# Patient Record
Sex: Female | Born: 1990 | Race: White | Hispanic: Yes | Marital: Single | Smoking: Never smoker
Health system: Southern US, Community
[De-identification: ages and names within clinical notes are randomized; demographics above are authoritative.]

## PROBLEM LIST (undated history)

## (undated) DIAGNOSIS — K831 Obstruction of bile duct: Secondary | ICD-10-CM

## (undated) DIAGNOSIS — N921 Excessive and frequent menstruation with irregular cycle: Secondary | ICD-10-CM

## (undated) HISTORY — PX: NO PAST SURGERIES: SHX2092

## (undated) HISTORY — DX: Excessive and frequent menstruation with irregular cycle: N92.1

## (undated) HISTORY — DX: Obstruction of bile duct: K83.1

---

## 2018-03-28 ENCOUNTER — Encounter (INDEPENDENT_AMBULATORY_CARE_PROVIDER_SITE_OTHER): Payer: Self-pay | Admitting: Nurse Practitioner

## 2018-03-28 ENCOUNTER — Encounter (INDEPENDENT_AMBULATORY_CARE_PROVIDER_SITE_OTHER): Payer: Self-pay | Admitting: Physician Assistant

## 2018-03-28 ENCOUNTER — Ambulatory Visit (INDEPENDENT_AMBULATORY_CARE_PROVIDER_SITE_OTHER): Payer: Self-pay | Admitting: Nurse Practitioner

## 2018-03-28 VITALS — BP 103/69 | HR 65 | Temp 98.5°F | Ht 58.27 in | Wt 114.8 lb

## 2018-03-28 DIAGNOSIS — Z833 Family history of diabetes mellitus: Secondary | ICD-10-CM

## 2018-03-28 DIAGNOSIS — N921 Excessive and frequent menstruation with irregular cycle: Secondary | ICD-10-CM

## 2018-03-28 DIAGNOSIS — Z131 Encounter for screening for diabetes mellitus: Secondary | ICD-10-CM

## 2018-03-28 DIAGNOSIS — R0789 Other chest pain: Secondary | ICD-10-CM

## 2018-03-28 LAB — POCT GLYCOSYLATED HEMOGLOBIN (HGB A1C): HEMOGLOBIN A1C: 5.4

## 2018-03-28 NOTE — Patient Instructions (Addendum)
Metrorragia (Metrorrhagia) La metrorragia es una hemorragia procedente del tero que ocurre de manera irregular pero frecuente. La hemorragia suele presentarse entre una menstruacin y la siguiente. INSTRUCCIONES PARA EL CUIDADO EN EL HOGAR Est atenta a cualquier cambio en los sntomas. Estas indicaciones pueden ayudarla con el trastorno: Comidas  Siga una dieta equilibrada. Incluya alimentos con [employer]Ex de hierro, como hgado, carne, Oceanographer, verduras de hoja verde y [place].  Si tiene estreimiento: ? [employer]. ? Consuma frutas y verduras con alto contenido de agua y [place], Schwana espinaca, zanahorias, frambuesas, manzanas y mango. Medicamentos  [employer] de venta libre y los recetados solamente como se lo haya indicado el mdico.  No haga cambios en los medicamentos sin hablar con el mdico.  La aspirina o los medicamentos que la contienen pueden aumentar la hemorragia. No tome esos medicamentos: ? Durante la semana previa a Tax adviser. ? Durante la [employer].  Si le recetaron comprimidos de hierro, Scientist, forensic se lo haya indicado el mdico. Estos ayudan a Restaurant manager, fast food hierro que el organismo pierde debido a este trastorno. Actividad  Si debe cambiarse el apsito o el tampn ms de una vez cada 2horas: ? Acustese con los pies elevados. ? Colquese una compresa fra en la parte baja del abdomen. ? Haga todo el reposo que pueda hasta que la hemorragia se detenga o disminuya.  No trate de Management consultant que la hemorragia se detenga y los niveles de hierro en la sangre se normalicen. Otras indicaciones  [employer], anote lo siguiente: ? La fecha de comienzo de Tax adviser. ? La fecha de su finalizacin. ? Los [employer] que tiene una hemorragia anormal. ? Los problemas que advierte.  Concurra a todas las visitas de control como se lo haya indicado el mdico. Esto es importante. SOLICITE ATENCIN MDICA SI:  Se siente  dbil o que va a desvanecerse.  Tiene nuseas y vmitos.  No puede comer ni beber sin vomitar.  Tiene mareos o diarrea mientras est [employer].  Est tomando anticonceptivos u hormonas, y desea cambiar o suspender estos medicamentos. SOLICITE ATENCIN MDICA DE INMEDIATO SI:  Tiene escalofros o fiebre.  Debe cambiarse el apsito o el tampn ms de una vez por hora.  La hemorragia se vuelve abundante.  El flujo menstrual contiene cogulos.  Siente dolor en el abdomen.  Pierde la conciencia.  Le aparece una erupcin cutnea. Esta informacin no tiene Theme park manager el consejo del mdico. Asegrese de hacerle al mdico cualquier pregunta que tenga. Document Released: 08/11/2005 Document Revised: 07/23/2015 Document Reviewed: 01/27/2015 Elsevier Interactive Patient Education  2018 [employer].  [employer] del mtodo anticonceptivo (Contraception Choices) La anticoncepcin (control de la natalidad) es el uso de cualquier mtodo o dispositivo para Location manager. A continuacin se indican algunos de esos mtodos. ANTICONCEPTIVOS HORMONALES  Un pequeo tubo colocado bajo la piel de la parte superior del brazo (implante). El tubo puede Geneticist, molecular durante 3 aos. El implante debe quitarse despus de 3 aos.  Inyecciones que se aplican cada 3 meses.  Pldoras que deben [employer]Life.  Parches que se cambian una vez por semana.  Un anillo que se coloca en la vagina (anillo vaginal). El anillo se deja en su lugar durante 3 semanas y se retira durante 1 semana. Luego se coloca un nuevo anillo en la vagina.  Pldoras para el control de la natalidad despus de Warehouse manager sexo (relaciones sexuales) sin proteccin.  ANTICONCEPTIVOS  DE Emeline Darling cubierta delgada que se [place] sobe el pene (condn masculino) que se Diplomatic Services operational officer las relaciones sexuales.  Una cubierta blanda y suelta que se coloca en la vagina (condn femenino) antes de las  relaciones sexuales.  Un dispositivo de goma que se aplica sobre el cuello del tero (diafragma). Este dispositivo debe ser hecho para usted. Se coloca en la vagina antes de Management consultant. Debe dejarlo colocado en la vagina durante 6 a 8 horas despus de las [employer].  Un capuchn pequeo y [employer] Pont se fija sobre el cuello del tero (capuchn cervical). Este capuchn debe ser hecho para usted. Debe dejarlo colocado en la vagina durante 48 horas despus de las [employer].  Una esponja que se coloca en la vagina antes de Management consultant.  Una sustancia qumica que destruye o impide que los espermatozoides ingresen al cuello y al tero (espermicida). La sustancia qumica puede ser en crema, gel, espuma o pldoras.  DISPOSITIVO DE CONTROL INTRAUTERINO (DIU)  El DIU es un pequeo dispositivo plstico en forma de T. Se coloca dentro del tero. Hay dos tipos de DIU: ? DIU de cobre. El dispositivo est recubierto en alambre de cobre. El cobre produce un lquido que [employer] Stores espermatozoides. Puede permanecer colocado durante 10 aos. ? DIU hormonal. La hormona impide que ocurra el embarazo. Puede permanecer colocado durante 5 aos.  MTODOS PERMANENTES  La mujer puede hacerse sellar, ligar u obstruir las trompas de [place] durante [place]. Esto impide que el vulo llegue hasta el tero.  El mdico coloca un alambre diminuto o lo inserta en cada una de las trompas de [place]. Esto produce un tejido cicatrizal que obstruye las trompas de [place].  En el hombre pueden ligarse los conductos por los que pasan los espermatozoides (vasectoma).  CONTROL DE LA NATALIDAD POR PLANIFICACIN FAMILIAR NATURAL  La planificacin familiar natural significa no tener Clinical research associate o usar un mtodo anticonceptivo de Warehouse manager en los perodos frtiles de la [place].  Use a un almanaque para llevar un registro de la extensin de cada perodo y para [employer] Dynamics que puede quedar [place].  Evite tener relaciones sexuales durante la ovulacin.  Use un termmetro para medir la Arts development officer. Tambin reconozca los sntomas de la ovulacin.  El momento de EchoStar relaciones sexuales debe ser despus de que la mujer [place]. Use condones para protegerse de las enfermedades de transmisin sexual (ETS). Hgalo independientemente del tipo de [employer] use. Hable con su mdico acerca de cul es el mejor mtodo anticonceptivo para usted. Esta informacin no tiene Theme park manager el consejo del mdico. Asegrese de hacerle al mdico cualquier pregunta que tenga. Document Released: 02/16/2011 Document Revised: 11/06/2013 Document Reviewed: 05/23/2013 Elsevier Interactive Patient Education  2017 Elsevier Inc.  Informacin sobre el dispositivo intrauterino (Intrauterine Device Information) Un dispositivo intrauterino (DIU) se inserta en el tero e impide el embarazo. Hay dos tipos de DIU:  DIU de cobre: este tipo de DIU est recubierto con un alambre de cobre y se inserta dentro del tero. El cobre hace que el tero y las trompas de [place] produzcan un liquido que [employer] espermatozoides. El DIU de cobre puede Geneticist, molecular durante 10 aos.  DIU con hormona: este tipo de DIU contiene la hormona progestina (progesterona sinttica). Las hormonas hacen que el moco cervical se haga ms espeso, lo que evita que el esperma ingrese al tero. Tambin hace que la membrana que recubre  internamente al tero sea ms delgada lo que impide el implante del vulo fertilizado. La hormona debilita o destruye los espermatozoides que ingresan al tero. Alguno de los tipos de DIU hormonal pueden Geneticist, molecular durante 5 aos y otros tipos pueden dejarse en el lugar por 3 aos. El mdico se asegurar de que usted sea una buena candidata para usar el DIU. Converse con su mdico acerca de los posibles efectos secundarios. VENTAJASDEL  DISPOSITIVO INTRAUTERINO  El DIU es muy eficaz, reversible, de accin prolongada y de bajo mantenimiento.  No hay efectos secundarios relacionados con el estrgeno.  El DIU puede ser utilizado durante la Market researcher.  No est asociado con el aumento de [place].  Funciona inmediatamente despus de la insercin.  El DIU hormonal funciona inmediatamente si se inserta dentro de los [address] del inicio del perodo. Ser necesario que utilice un mtodo anticonceptivo adicional durante 7 das si el DIU hormonal se inserta en algn otro momento del ciclo.  El DIU de cobre no interfiere con las hormonas femeninas.  El DIU hormonal puede hacer que los perodos menstruales abundantes se hagan ms ligeros y que haya menos clicos.  El DIU hormonal puede usarse durante 3 a 5 aos.  El DIU de cobre puede usarse durante 10 aos.  DESVENTAJASDEL DISPOSITIVO [employer] DIU hormonal puede estar asociado con patrones de sangrado irregular.  El DIU de cobre puede hacer que el flujo menstrual ms abundante y doloroso.  Puede experimentar clicos y sangrado vaginal despus de la insercin.  Esta informacin no tiene Theme park manager el consejo del mdico. Asegrese de hacerle al mdico cualquier pregunta que tenga. Document Released: 04/21/2010 Document Revised: 02/23/2016 Document Reviewed: 04/22/2013 Elsevier Interactive Patient Education  2017 [employer].  Informacin sobre los anticonceptivos orales (Oral Contraception Information) Los anticonceptivos orales (ACO) son medicamentos que se utilizan para Location manager. Su funcin es [employer] Corporation ovarios liberen vulos. Las hormonas de los ACO tambin hacen que el moco cervical se haga ms espeso, lo que evita que el esperma ingrese al tero. Tambin hacen que la membrana que recubre internamente al tero se vuelva ms fina, lo que no permite que el huevo fertilizado se adhiera a la pared del tero. Los ACO son muy efectivos cuando se toman  exactamente como se prescriben. Sin embargo, no previenen contra las enfermedades de transmisin sexual (ETS). La prctica del sexo seguro, como el uso de preservativos, junto con la [place], [place] a prevenir ese tipo de enfermedades. Antes de tomar la pldora, usted debe hacerse un examen fsico y un test de Pap. El mdico podr indicarle anlisis de [place], si es necesario. El mdico se asegurar de que usted sea [place] buena candidata para usar anticonceptivos orales. Converse con su mdico acerca de los posibles efectos secundarios de los ACO que podran recetarle. Cuando se inicia el uso de ACO, puede llevar 2 a 3 meses para que su organismo se adapte a los cambios en los niveles hormonales. TIPOS DE ANTICONCEPTIVOS ORALES  Pldora combinada: esta pldora contiene las hormonas estrgeno y progestina (progesterona sinttica). La pldora combinada viene en envases para [address], [address] o [address] Algunos tipos de pldoras combinadas deben tomarse de manera continua (pldoras para 365 das). En los envases para [address] usted no tomar las pldoras durante 7 [address] despus de la ltima pldora. En los envases para [address], la pldora se toma [employer]. Las ltimas 7 no contienen hormonas. Ciertos tipos de pldoras  tienen ms de 21 pldoras que contienen hormonas. En los envases para [address] las primeras 84 pldoras contienen ambas hormonas y las ltimas 7 pldoras no contienen hormonas o contienen slo Cabin crew.  La minipldora: esta pldora contiene la hormona progesterona solamente. Es necesario tomarla todos los das de [place] continua. Es importante que las tome a la misma hora todos [place]. Viene en envases de 28 pldoras. Las 28 pldoras contienen la hormona. VENTAJAS DE LOS ANTICONCEPTIVOS ORALES  Disminuye los sntomas premenstruales.  Se [place] para tratar los [employer].  Regula el ciclo menstrual.  Disminuye el ciclo menstrual abundante.  Puede mejorar el acn, segn el  tipo de pldora.  Trata hemorragias uterinas anormales.  Trata el sndrome ovrico poliqustico.  Trata la endometriosis.  Pueden usarse como anticonceptivo de Associate Professor. FACTORES QUE PUEDEN HACER QUE LOS ANTICONCEPTIVOS ORALES SEAN MENOS EFECTIVOS Pueden ser menos efectivos si:  Olvid tomar la [employer] a la misma hora.  Tiene una enfermedad estomacal o intestinal que disminuye la absorcin de la pldora.  Ingiere simultneamente los anticonceptivos orales junto con otros medicamentos que los hacen menos efectivos, como antibiticos, ciertos medicamentos para el VIH y algunos medicamentos para las convulsiones.  Usted toma anticonceptivos orales que han vencido.  Cuando se [place] el envase de [place], se olvida de recomenzar el uso [employer] 7. RIESGOS ASOCIADOS AL USO DE ANTICONCEPTIVOS ORALES Los anticonceptivos orales pueden en algunos casos causar efectos secundarios como:  Dolor de [place].  Nuseas.  Inflamacin mamaria.  Hemorragia vaginal o manchado irregular. Las pldoras combinadas tambin se asocian a un pequeo aumento en el riesgo de:  Cogulos sanguneos.  Ataque cardaco.  Ictus. Esta informacin no tiene Theme park manager el consejo del mdico. Asegrese de hacerle al mdico cualquier pregunta que tenga. Document Released: 08/11/2005 Document Revised: 02/23/2016 Document Reviewed: 04/22/2013 Elsevier Interactive Patient Education  [employer].

## 2018-03-28 NOTE — Progress Notes (Signed)
3  Assessment & Plan:  Carrie Delacruz was seen today for new patient (initial visit).  Diagnoses and all orders for this visit:  Other chest pain -     CBC -     CMP14+EGFR -     Lipid panel -     TSH  Family history of diabetes mellitus in mother -     Hemoglobin A1c  Metrorrhagia Handout given on various types of birth control agents. Patient is not ready to make a decision today.    Patient has been counseled on age-appropriate routine health concerns for screening and prevention. These are reviewed and up-to-date. Referrals have been placed accordingly. Immunizations are up-to-date or declined.    Subjective:   Chief Complaint  Patient presents with  . New Patient (Initial Visit)    chest pain   HPI Carrie Delacruz 27 y.o. female presents to office today to establish care today. She has not seen a primary care provider in several months.    Chest Pain Patient complains of chest pain. Onset was 2 months ago, with stable course since that time. The patient describes the pain as intermittent, pressure like and sharp in nature, does not radiate. Patient rates pain as a 8/10 in intensity.  Associated symptoms are chest pressure/discomfort, dyspnea and exertional chest pressure/discomfort. Aggravating factors are cold air.  Alleviating factors are: none. Patient's cardiac risk factors are none.  Patient's risk factors for DVT/PE: none. Previous cardiac testing: none.  Dysmenorrhea Patient complains of metrorrhagia symptoms. Symptoms began several years ago. Menstrual cycles occur every 2-3 weeks and sometimes every other month. Patient denies anxiety, depression and pelvic pain. Evaluation to date includes none. Treatment to date includes Depo-Provera (discontinued due to lack of efficacy). The patient is sexually active and desires pregnancy. She is reluctant to start birth control as she may desire pregnancy within the year.    Review of Systems  Constitutional:  Negative for fever, malaise/fatigue and weight loss.  HENT: Negative.  Negative for nosebleeds.   Eyes: Negative.  Negative for blurred vision, double vision and photophobia.  Respiratory: Positive for shortness of breath (currently denies). Negative for cough.   Cardiovascular: Positive for chest pain (currently denies). Negative for palpitations and leg swelling.  Gastrointestinal: Negative.  Negative for heartburn, nausea and vomiting.  Genitourinary:       SEE HPI  Musculoskeletal: Negative.  Negative for myalgias.  Neurological: Negative.  Negative for dizziness, focal weakness, seizures and headaches.  Psychiatric/Behavioral: Negative.  Negative for suicidal ideas.    Past Medical History:  Diagnosis Date  . Metrorrhagia     History reviewed. No pertinent surgical history.  Family History  Problem Relation Age of Onset  . Diabetes Mother   . Asthma Mother     Social History Reviewed with no changes to be made today.   No outpatient medications prior to visit.   No facility-administered medications prior to visit.        Objective:    BP 103/69 (BP Location: Left Arm, Patient Position: Sitting, Cuff Size: Normal)   Pulse 65   Temp 98.5 F (36.9 C) (Oral)   Ht 4' 10.27" (1.48 m)   Wt 114 lb 12.8 oz (52.1 kg)   LMP 03/21/2018 (Approximate)   SpO2 100%   BMI 23.77 kg/m  Wt Readings from Last 3 Encounters:  03/28/18 114 lb 12.8 oz (52.1 kg)    Physical Exam  Constitutional: She is oriented to person, place, and time. She appears well-developed  and well-nourished. She is cooperative.  HENT:  Head: Normocephalic and atraumatic.  Eyes: EOM are normal.  Neck: Normal range of motion.  Cardiovascular: Normal rate, regular rhythm and normal heart sounds. Exam reveals no gallop and no friction rub.  No murmur heard. Pulmonary/Chest: Effort normal and breath sounds normal. No tachypnea. No respiratory distress. She has no decreased breath sounds. She has no wheezes.  She has no rhonchi. She has no rales. She exhibits no tenderness.  Abdominal: Soft. Bowel sounds are normal.  Musculoskeletal: Normal range of motion. She exhibits no edema.  Neurological: She is alert and oriented to person, place, and time. Coordination normal.  Skin: Skin is warm and dry.  Psychiatric: She has a normal mood and affect. Her behavior is normal. Judgment and thought content normal.  Nursing note and vitals reviewed.      Patient has been counseled extensively about nutrition and exercise as well as the importance of adherence with medications and regular follow-up. The patient was given clear instructions to go to ER or return to medical center if symptoms don't improve, worsen or new problems develop. The patient verbalized understanding.   Follow-up: Return in about 3 months (around 06/28/2018) for F/U PAP SMEAR; , Needs appointment with financial representative.Gildardo Pounds, FNP-BC [facility] [place], [place]   03/28/2018, 5:01 PM

## 2018-03-29 LAB — CBC
Hematocrit: 39.6 % (ref 34.0–46.6)
Hemoglobin: 13.8 g/dL (ref 11.1–15.9)
MCH: 29.5 pg (ref 26.6–33.0)
MCHC: 34.8 g/dL (ref 31.5–35.7)
MCV: 85 fL (ref 79–97)
Platelets: 265 10*3/uL (ref 150–379)
RBC: 4.68 x10E6/uL (ref 3.77–5.28)
RDW: 13.8 % (ref 12.3–15.4)
WBC: 6.5 10*3/uL (ref 3.4–10.8)

## 2018-03-29 LAB — CMP14+EGFR
A/G RATIO: 1.4 (ref 1.2–2.2)
ALBUMIN: 4.2 g/dL (ref 3.5–5.5)
ALK PHOS: 82 IU/L (ref 39–117)
ALT: 10 IU/L (ref 0–32)
AST: 13 IU/L (ref 0–40)
BILIRUBIN TOTAL: 0.3 mg/dL (ref 0.0–1.2)
BUN / CREAT RATIO: 25 — AB (ref 9–23)
BUN: 16 mg/dL (ref 6–20)
CHLORIDE: 102 mmol/L (ref 96–106)
CO2: 27 mmol/L (ref 20–29)
Calcium: 9.5 mg/dL (ref 8.7–10.2)
Creatinine, Ser: 0.64 mg/dL (ref 0.57–1.00)
GFR calc Af Amer: 142 mL/min/{1.73_m2} (ref >59)
GFR calc non Af Amer: 124 mL/min/{1.73_m2} (ref >59)
GLUCOSE: 103 mg/dL — AB (ref 65–99)
Globulin, Total: 3 g/dL (ref 1.5–4.5)
POTASSIUM: 3.8 mmol/L (ref 3.5–5.2)
Sodium: 142 mmol/L (ref 134–144)
Total Protein: 7.2 g/dL (ref 6.0–8.5)

## 2018-03-29 LAB — LIPID PANEL
Chol/HDL Ratio: 2.6 ratio (ref 0.0–4.4)
Cholesterol, Total: 148 mg/dL (ref 100–199)
HDL: 57 mg/dL (ref >39)
LDL Calculated: 78 mg/dL (ref 0–99)
Triglycerides: 67 mg/dL (ref 0–149)
VLDL Cholesterol Cal: 13 mg/dL (ref 5–40)

## 2018-03-29 LAB — TSH: TSH: 1.14 u[IU]/mL (ref 0.450–4.500)

## 2018-04-07 ENCOUNTER — Telehealth (INDEPENDENT_AMBULATORY_CARE_PROVIDER_SITE_OTHER): Payer: Self-pay

## 2018-04-07 NOTE — Telephone Encounter (Signed)
-----   Message from Claiborne Rigg, NP sent at 04/05/2018  8:45 AM EDT ----- All of your labs are essentially normal: You have no anemia, your thyroid and cholesterol levels are normal as well. INSTRUCTIONS: Work on a low fat, heart healthy diet and participate in regular aerobic exercise program by working out at least 150 minutes per week. No fried foods. No junk foods, sodas, sugary drinks, unhealthy snacking, alcohol or smoking.

## 2018-04-07 NOTE — Telephone Encounter (Signed)
Called patient using pacific interpreters Joselyn [phone]) patient did not answer. Left the following message, All of your labs are essentially normal: You have no anemia, your thyroid and cholesterol levels are normal as well. INSTRUCTIONS: Work on a low fat, heart healthy diet and participate in regular aerobic exercise program by working out at least 150 minutes per week. No fried foods. No junk foods, sodas, sugary drinks, unhealthy snacking, alcohol or smoking. Please call [facility] with any questions. Maryjean Morn, CMA

## 2018-06-28 ENCOUNTER — Ambulatory Visit (INDEPENDENT_AMBULATORY_CARE_PROVIDER_SITE_OTHER): Payer: Self-pay | Admitting: Physician Assistant

## 2018-08-01 ENCOUNTER — Ambulatory Visit (INDEPENDENT_AMBULATORY_CARE_PROVIDER_SITE_OTHER): Payer: Self-pay | Admitting: Physician Assistant

## 2018-10-17 ENCOUNTER — Encounter (HOSPITAL_COMMUNITY): Payer: Self-pay | Admitting: Emergency Medicine

## 2018-10-17 ENCOUNTER — Ambulatory Visit (HOSPITAL_COMMUNITY)
Admission: EM | Admit: 2018-10-17 | Discharge: 2018-10-17 | Disposition: A | Discharge status: Home / Self Care | Payer: Self-pay | Attending: Internal Medicine | Admitting: Internal Medicine

## 2018-10-17 ENCOUNTER — Other Ambulatory Visit: Payer: Self-pay

## 2018-10-17 DIAGNOSIS — H66001 Acute suppurative otitis media without spontaneous rupture of ear drum, right ear: Secondary | ICD-10-CM

## 2018-10-17 LAB — POCT PREGNANCY, URINE
PREG TEST UR: NEGATIVE
Preg Test, Ur: NEGATIVE
Preg Test, Ur: NEGATIVE

## 2018-10-17 MED ORDER — AMOXICILLIN 875 MG PO TABS: 875.0000 mg | ORAL_TABLET | Freq: Two times a day (BID) | ORAL | 0 refills | Status: AC

## 2018-10-17 MED FILL — AMOXICILLIN 875 MG TABS: 875 | 10 days supply | Qty: 20 | Fill #0

## 2018-10-17 NOTE — ED Triage Notes (Signed)
[employer] interpreter used for triage.  

## 2018-10-17 NOTE — ED Provider Notes (Signed)
[facility]    CSN: [id number] Arrival date & time: 10/17/18  1115     History   Chief Complaint Chief Complaint  Patient presents with  . Otalgia    HPI Carrie Delacruz is a 27 y.o. female.   27 year old female with no chronic medical problems presents to urgent care complaining of pain in her right ear.  Pain began 3 days ago.  She denies fevers but reports that the pain develops into a headache at times and that she can "hear when going through her ear".  It also hurts to lay on her ear at night.     Past Medical History:  Diagnosis Date  . Metrorrhagia     There are no active problems to display for this patient.   History reviewed. No pertinent surgical history.  OB History   None      Home Medications    Prior to Admission medications   Medication Sig Start Date End Date Taking? Authorizing Provider  amoxicillin (AMOXIL) 875 MG tablet Take 1 tablet (875 mg total) by mouth 2 (two) times daily for 10 days. 10/17/18 10/27/18  Arnaldo Nataliamond, Tobie Perdue S, MD    Family History Family History  Problem Relation Age of Onset  . Diabetes Mother   . Asthma Mother     Social History Social History   Tobacco Use  . Smoking status: Never Smoker  . Smokeless tobacco: Never Used  Substance Use Topics  . Alcohol use: Never    Frequency: Never  . Drug use: Never     Allergies   Patient has no known allergies.   Review of Systems Review of Systems  Constitutional: Negative for chills and fever.  HENT: Positive for ear pain. Negative for sore throat and tinnitus.   Eyes: Negative for redness.  Respiratory: Negative for cough and shortness of breath.   Cardiovascular: Negative for chest pain and palpitations.  Gastrointestinal: Negative for abdominal pain, diarrhea, nausea and vomiting.  Genitourinary: Negative for dysuria, frequency and urgency.  Musculoskeletal: Negative for myalgias.  Skin: Negative for rash.       No lesions    Neurological: Negative for weakness.  Hematological: Does not bruise/bleed easily.  Psychiatric/Behavioral: Negative for suicidal ideas.     Physical Exam Triage Vital Signs ED Triage Vitals  Enc Vitals Group     BP 10/17/18 1231 117/72     Pulse Rate 10/17/18 1231 86     Resp 10/17/18 1231 16     Temp 10/17/18 1231 98.6 F (37 C)     Temp Source 10/17/18 1231 Oral     SpO2 10/17/18 1231 99 %     Weight --      Height --      Head Circumference --      Peak Flow --      Pain Score 10/17/18 1227 8     Pain Loc --      Pain Edu? --      Excl. in GC? --    No data found.  Updated Vital Signs BP 117/72   Pulse 86   Temp 98.6 F (37 C) (Oral)   Resp 16   LMP 08/17/2018   SpO2 99%   Visual Acuity Right Eye Distance:   Left Eye Distance:   Bilateral Distance:    Right Eye Near:   Left Eye Near:    Bilateral Near:     Physical Exam  Constitutional: She is oriented to person, place,  and time. She appears well-developed and well-nourished. No distress.  HENT:  Head: Normocephalic and atraumatic.  Right Ear: Tympanic membrane is bulging.  Mouth/Throat: Oropharynx is clear and moist.  Eyes: Pupils are equal, round, and reactive to light. Conjunctivae and EOM are normal. No scleral icterus.  Neck: Normal range of motion. Neck supple. No JVD present. No tracheal deviation present. No thyromegaly present.  Cardiovascular: Normal rate, regular rhythm and normal heart sounds. Exam reveals no gallop and no friction rub.  No murmur heard. Pulmonary/Chest: Effort normal and breath sounds normal.  Abdominal: Soft. Bowel sounds are normal. She exhibits no distension. There is no tenderness.  Musculoskeletal: Normal range of motion. She exhibits no edema.  Lymphadenopathy:    She has no cervical adenopathy.  Neurological: She is alert and oriented to person, place, and time. No cranial nerve deficit.  Skin: Skin is warm and dry.  Psychiatric: She has a normal mood and  affect. Her behavior is normal. Judgment and thought content normal.  Nursing note and vitals reviewed.    UC Treatments / Results  Labs (all labs ordered are listed, but only abnormal results are displayed) Labs Reviewed  POCT PREGNANCY, URINE  POCT PREGNANCY, URINE    EKG None  Radiology No results found.  Procedures Procedures (including critical care time)  Medications Ordered in UC Medications - No data to display  Initial Impression / Assessment and Plan / UC Course  I have reviewed the triage vital signs and the nursing notes.  Pertinent labs & imaging results that were available during my care of the patient were reviewed by me and considered in my medical decision making (see chart for details).     Right acute otitis media.  No other symptoms of sepsis or perforation. Final Clinical Impressions(s) / UC Diagnoses   Final diagnoses:  Non-recurrent acute suppurative otitis media of right ear without spontaneous rupture of tympanic membrane   Discharge Instructions   None    ED Prescriptions    Medication Sig Dispense Auth. Provider   amoxicillin (AMOXIL) 875 MG tablet Take 1 tablet (875 mg total) by mouth 2 (two) times daily for 10 days. 20 tablet Arnaldo Natal, MD     Controlled Substance Prescriptions [place] Controlled Substance Registry consulted? Not Applicable   Arnaldo Natal, MD 10/17/18 1358

## 2018-10-17 NOTE — ED Triage Notes (Signed)
PT reports right ear pain

## 2018-10-17 NOTE — ED Triage Notes (Signed)
Pain has been present for 2 days. Could not sleep last night due to pain.  PT reports a fullness sensation.

## 2019-11-02 ENCOUNTER — Other Ambulatory Visit: Payer: Self-pay

## 2019-11-02 DIAGNOSIS — Z20822 Contact with and (suspected) exposure to covid-19: Secondary | ICD-10-CM

## 2019-11-04 ENCOUNTER — Telehealth: Payer: Self-pay | Admitting: Critical Care Medicine

## 2019-11-04 LAB — NOVEL CORONAVIRUS, NAA: SARS-CoV-2, NAA: DETECTED — AB

## 2019-11-04 NOTE — Telephone Encounter (Signed)
-----   Message from Sarah E Ellington, RN sent at 11/04/2019  6:55 AM EST -----  ----- Message ----- From: Interface, [facility] Lab Results In Sent: 11/04/2019  12:35 AM EST To: Mobile Screening Testing Result Pool   

## 2019-11-04 NOTE — Telephone Encounter (Signed)
I connected this patient is Covid positive from a December 18 testing event.  The patient is asymptomatic.  Her son is also positive.  They both need to stay in isolation till December 29.  She is not a monoclonal antibody candidate.

## 2020-11-15 NOTE — L&D Delivery Note (Addendum)
OB/GYN Faculty Practice Delivery Note  Carrie Delacruz is a 30 y.o. G2P0101 at [redacted]w[redacted]d. She was admitted for SOL secondary to PPROM.   ROM: 20h 63m with clear fluid GBS Status: Unknown, adequate ppx with PCN Maximum Maternal Temperature: 99*F  Labor Progress: Patient had PPROM at home around 9 PM on 06/22/21 with subsequent contractions. She was expectantly managed over night and then ctx decreased. She received cytotec x1, then pitocin and progressed to complete.  Delivery Date/Time: 06/23/21 at 1804  Delivery: Called to room and patient was complete and pushing. Head delivered direct OA with compound arm. No nuchal cord present. Shoulder and body delivered in usual fashion. Infant with spontaneous cry, placed on mother's abdomen, dried and stimulated. Cord clamped x 2 after 1-minute delay, and cut by myself. Cord blood drawn. Placenta delivered spontaneously with gentle cord traction. Fundus firm with massage and Pitocin. Labia, perineum, vagina, and cervix inspected with hemostatic left labial laceration that was not repaired.   Placenta: 3VC, intact Complications: None Lacerations: Hemostatic left labial  EBL: 125cc Analgesia: Epidural  Infant: Viable female taken to NICU due to preterm status  APGARs 9,9  2310g  Shirlean Mylar, MD [facility], PGY-3 06/23/2021, 6:32 PM   GME ATTESTATION:  I saw and evaluated the patient. I agree with the findings and the plan of care as documented in the resident's note. I was gowned, gloved, and present for the entire delivery. I have made changes to documentation as necessary.  Evalina Field, MD OB Fellow, Faculty [facility], Center for [facility] 06/23/2021 8:38 PM

## 2021-01-28 ENCOUNTER — Encounter: Payer: Self-pay | Admitting: Obstetrics and Gynecology

## 2021-01-28 ENCOUNTER — Other Ambulatory Visit (HOSPITAL_COMMUNITY)
Admission: RE | Admit: 2021-01-28 | Discharge: 2021-01-28 | Disposition: A | Discharge status: Home / Self Care | Payer: Self-pay | Source: Ambulatory Visit | Attending: Obstetrics and Gynecology | Admitting: Obstetrics and Gynecology

## 2021-01-28 ENCOUNTER — Ambulatory Visit (INDEPENDENT_AMBULATORY_CARE_PROVIDER_SITE_OTHER): Payer: Self-pay | Admitting: Obstetrics and Gynecology

## 2021-01-28 ENCOUNTER — Other Ambulatory Visit: Payer: Self-pay

## 2021-01-28 DIAGNOSIS — O0990 Supervision of high risk pregnancy, unspecified, unspecified trimester: Secondary | ICD-10-CM | POA: Insufficient documentation

## 2021-01-28 DIAGNOSIS — Z8759 Personal history of other complications of pregnancy, childbirth and the puerperium: Secondary | ICD-10-CM

## 2021-01-28 DIAGNOSIS — Z23 Encounter for immunization: Secondary | ICD-10-CM

## 2021-01-28 DIAGNOSIS — Z3A10 10 weeks gestation of pregnancy: Secondary | ICD-10-CM | POA: Insufficient documentation

## 2021-01-28 DIAGNOSIS — Z8719 Personal history of other diseases of the digestive system: Secondary | ICD-10-CM | POA: Insufficient documentation

## 2021-01-28 NOTE — Progress Notes (Signed)
INITIAL PRENATAL VISIT NOTE  Subjective:  Carrie Delacruz is a 30 y.o. G2P1 at [redacted]w[redacted]d by LMP 11/18/20  Approximately, being seen today for her initial prenatal visit.   She was using nothing for birth control previously. She has an obstetric history significant for cholestasis of pregnancy. She has a medical history significant for nothing.  Patient reports no complaints.  Contractions: Not present. Vag. Bleeding: None.  Movement: Absent. Denies leaking of fluid.    Past Medical History:  Diagnosis Date  . Cholestasis   . Metrorrhagia     Past Surgical History:  Procedure Laterality Date  . NO PAST SURGERIES      OB History  Gravida Para Term Preterm AB Living  2         1  SAB IAB Ectopic Multiple Live Births               # Outcome Date GA Lbr Len/2nd Weight Sex Delivery Anes PTL Lv  2 Current           1 Gravida             Social History   Socioeconomic History  . Marital status: Single    Spouse name: Not on file  . Number of children: Not on file  . Years of education: Not on file  . Highest education level: Not on file  Occupational History  . Not on file  Tobacco Use  . Smoking status: Never Smoker  . Smokeless tobacco: Never Used  Substance and Sexual Activity  . Alcohol use: Never  . Drug use: Never  . Sexual activity: Yes    Birth control/protection: None    Comment: Depo Provera  Other Topics Concern  . Not on file  Social History Narrative  . Not on file   Social Determinants of Health   Financial Resource Strain: Not on file  Food Insecurity: Not on file  Transportation Needs: Not on file  Physical Activity: Not on file  Stress: Not on file  Social Connections: Not on file    Family History  Problem Relation Age of Onset  . Diabetes Mother   . Asthma Mother      Current Outpatient Medications:  .  Prenatal Vit-Fe Fumarate-FA (PRENATAL MULTIVITAMIN) TABS tablet, Take 1 tablet by mouth daily at 12 noon., Disp: , Rfl:    No Known Allergies  Review of Systems: Negative except for what is mentioned in HPI.  Objective:   Vitals:   01/28/21 0927  BP: 117/78  Pulse: 76  Weight: 136 lb 14.4 oz (62.1 kg)    Fetal Status: Fetal Heart Rate (bpm): 169   Movement: Absent     Physical Exam: BP 117/78   Pulse 76   Wt 136 lb 14.4 oz (62.1 kg)   LMP 11/18/2020   BMI 28.35 kg/m  CONSTITUTIONAL: Well-developed, well-nourished female in no acute distress.  NEUROLOGIC: Alert and oriented to person, place, and time. Normal reflexes, muscle tone coordination. No cranial nerve deficit noted. PSYCHIATRIC: Normal mood and affect. Normal behavior. Normal judgment and thought content. SKIN: Skin is warm and dry. No rash noted. Not diaphoretic. No erythema. No pallor. HENT:  Normocephalic, atraumatic, External right and left ear normal. Oropharynx is clear and moist EYES: Conjunctivae and EOM are normal. No scleral icterus.  NECK: Normal range of motion, supple, no masses CARDIOVASCULAR: Normal heart rate noted, regular rhythm RESPIRATORY: Effort and breath sounds normal, no problems with respiration noted BREASTS: symmetric, non-tender,  no masses palpable ABDOMEN: Soft, nontender, nondistended, gravid. GU: normal appearing external female genitalia, multiparous normal appearing cervix, scant white discharge in vagina, no lesions noted Bimanual: 11 weeks sized uterus, no adnexal tenderness or palpable lesions noted MUSCULOSKELETAL: Normal range of motion. EXT:  No edema and no tenderness. 2+ distal pulses.   Assessment and Plan:  Pregnancy: G2P1 at [redacted]w[redacted]d by approximate LMP  1. Supervision of high risk pregnancy, antepartum Routine labs and care at this time - CBC/D/Plt+RPR+Rh+ABO+Rub Ab... - Culture, OB Urine - Flu Vaccine QUAD 36+ mos IM - Genetic Screening - US MFM OB DETAIL +14 WK; Future - Hemoglobin A1c - Cervicovaginal ancillary only( [place])  2. [redacted] weeks gestation of pregnancy   3. History  of cholestasis during pregnancy Also will check CMP for baseline liver function - Bile acids, total   Preterm labor symptoms and general obstetric precautions including but not limited to vaginal bleeding, contractions, leaking of fluid and fetal movement were reviewed in detail with the patient.  Please refer to After Visit Summary for other counseling recommendations.   Return in about 4 weeks (around 02/25/2021) for ROB, in person.  Warden Fillers 01/28/2021 9:39 AM

## 2021-01-28 NOTE — Patient Instructions (Signed)
https://www.cdc.gov/pregnancy/infections.html">  First Trimester of Pregnancy  The first trimester of pregnancy starts on the first day of your last menstrual period until the end of week 12. This is also called months 1 through 3 of pregnancy. Body changes during your first trimester Your body goes through many changes during pregnancy. The changes usually return to normal after your baby is born. Physical changes  You may gain or lose weight.  Your breasts may grow larger and hurt. The area around your nipples may get darker.  Dark spots or blotches may develop on your face.  You may have changes in your hair. Health changes  You may feel like you might vomit (nauseous), and you may vomit.  You may have heartburn.  You may have headaches.  You may have trouble pooping (constipation).  Your gums may bleed. Other changes  You may get tired easily.  You may pee (urinate) more often.  Your menstrual periods will stop.  You may not feel hungry.  You may want to eat certain kinds of food.  You may have changes in your emotions from day to day.  You may have more dreams. Follow these instructions at home: Medicines  Take over-the-counter and prescription medicines only as told by your doctor. Some medicines are not safe during pregnancy.  Take a prenatal vitamin that contains at least 600 micrograms (mcg) of folic acid. Eating and drinking  Eat healthy meals that include: ? Fresh fruits and vegetables. ? Whole grains. ? Good sources of protein, such as meat, eggs, or tofu. ? Low-fat dairy products.  Avoid raw meat and unpasteurized juice, milk, and cheese.  If you feel like you may vomit, or you vomit: ? Eat 4 or 5 small meals a day instead of 3 large meals. ? Try eating a few soda crackers. ? Drink liquids between meals instead of during meals.  You may need to take these actions to prevent or treat trouble pooping: ? Drink enough fluids to keep your pee  (urine) pale yellow. ? Eat foods that are high in fiber. These include beans, whole grains, and fresh fruits and vegetables. ? Limit foods that are high in fat and sugar. These include fried or sweet foods. Activity  Exercise only as told by your doctor. Most people can do their usual exercise routine during pregnancy.  Stop exercising if you have cramps or pain in your lower belly (abdomen) or low back.  Do not exercise if it is too hot or too humid, or if you are in a place of great height (high altitude).  Avoid heavy lifting.  If you choose to, you may have sex unless your doctor tells you not to. Relieving pain and discomfort  Wear a good support bra if your breasts are sore.  Rest with your legs raised (elevated) if you have leg cramps or low back pain.  If you have bulging veins (varicose veins) in your legs: ? Wear support hose as told by your doctor. ? Raise your feet for 15 minutes, 3-4 times a day. ? Limit salt in your food. Safety  Wear your seat belt at all times when you are in a car.  Talk with your doctor if someone is hurting you or yelling at you.  Talk with your doctor if you are feeling sad or have thoughts of hurting yourself. Lifestyle  Do not use hot tubs, steam rooms, or saunas.  Do not douche. Do not use tampons or scented sanitary pads.  Do not   use herbal medicines, illegal drugs, or medicines that are not approved by your doctor. Do not drink alcohol.  Do not smoke or use any products that contain nicotine or tobacco. If you need help quitting, ask your doctor.  Avoid cat litter boxes and soil that is used by cats. These carry germs that can cause harm to the baby and can cause a loss of your baby by miscarriage or stillbirth. General instructions  Keep all follow-up visits. This is important.  Ask for help if you need counseling or if you need help with nutrition. Your doctor can give you advice or tell you where to go for help.  Visit your  dentist. At home, brush your teeth with a soft toothbrush. Floss gently.  Write down your questions. Take them to your prenatal visits. Where to find more information  American Pregnancy Association: americanpregnancy.org  American College of Obstetricians and Gynecologists: www.acog.org  Office on Women's Health: womenshealth.gov/pregnancy Contact a doctor if:  You are dizzy.  You have a fever.  You have mild cramps or pressure in your lower belly.  You have a nagging pain in your belly area.  You continue to feel like you may vomit, you vomit, or you have watery poop (diarrhea) for 24 hours or longer.  You have a bad-smelling fluid coming from your vagina.  You have pain when you pee.  You are exposed to a disease that spreads from person to person, such as chickenpox, measles, Zika virus, HIV, or hepatitis. Get help right away if:  You have spotting or bleeding from your vagina.  You have very bad belly cramping or pain.  You have shortness of breath or chest pain.  You have any kind of injury, such as from a fall or a car crash.  You have new or increased pain, swelling, or redness in an arm or leg. Summary  The first trimester of pregnancy starts on the first day of your last menstrual period until the end of week 12 (months 1 through 3).  Eat 4 or 5 small meals a day instead of 3 large meals.  Do not smoke or use any products that contain nicotine or tobacco. If you need help quitting, ask your doctor.  Keep all follow-up visits. This information is not intended to replace advice given to you by your health care provider. Make sure you discuss any questions you have with your health care provider. Document Revised: 04/09/2020 Document Reviewed: 02/14/2020 Elsevier Patient Education  2021 Elsevier Inc.  

## 2021-01-29 LAB — COMPREHENSIVE METABOLIC PANEL
ALT: 16 IU/L (ref 0–32)
AST: 24 IU/L (ref 0–40)
Albumin/Globulin Ratio: 1.3 (ref 1.2–2.2)
Albumin: 4.3 g/dL (ref 3.9–5.0)
Alkaline Phosphatase: 67 IU/L (ref 44–121)
BUN/Creatinine Ratio: 17 (ref 9–23)
BUN: 9 mg/dL (ref 6–20)
Bilirubin Total: 0.2 mg/dL (ref 0.0–1.2)
CO2: 19 mmol/L — ABNORMAL LOW (ref 20–29)
Calcium: 9.6 mg/dL (ref 8.7–10.2)
Chloride: 99 mmol/L (ref 96–106)
Creatinine, Ser: 0.52 mg/dL — ABNORMAL LOW (ref 0.57–1.00)
Globulin, Total: 3.3 g/dL (ref 1.5–4.5)
Glucose: 75 mg/dL (ref 65–99)
Potassium: 3.9 mmol/L (ref 3.5–5.2)
Sodium: 134 mmol/L (ref 134–144)
Total Protein: 7.6 g/dL (ref 6.0–8.5)
eGFR: 129 mL/min/{1.73_m2} (ref >59)

## 2021-01-29 LAB — CBC/D/PLT+RPR+RH+ABO+RUB AB...
Antibody Screen: NEGATIVE
Basophils Absolute: 0 10*3/uL (ref 0.0–0.2)
Basos: 0 %
EOS (ABSOLUTE): 0.1 10*3/uL (ref 0.0–0.4)
Eos: 1 %
HCV Ab: <0.1 s/co ratio (ref 0.0–0.9)
HIV Screen 4th Generation wRfx: NONREACTIVE
Hematocrit: 43.7 % (ref 34.0–46.6)
Hemoglobin: 14.9 g/dL (ref 11.1–15.9)
Hepatitis B Surface Ag: NEGATIVE
Immature Grans (Abs): 0 10*3/uL (ref 0.0–0.1)
Immature Granulocytes: 0 %
Lymphocytes Absolute: 1.9 10*3/uL (ref 0.7–3.1)
Lymphs: 17 %
MCH: 30.2 pg (ref 26.6–33.0)
MCHC: 34.1 g/dL (ref 31.5–35.7)
MCV: 89 fL (ref 79–97)
Monocytes Absolute: 0.5 10*3/uL (ref 0.1–0.9)
Monocytes: 5 %
Neutrophils Absolute: 8.6 10*3/uL — ABNORMAL HIGH (ref 1.4–7.0)
Neutrophils: 77 %
Platelets: 310 10*3/uL (ref 150–450)
RBC: 4.93 x10E6/uL (ref 3.77–5.28)
RDW: 13.2 % (ref 11.7–15.4)
RPR Ser Ql: NONREACTIVE
Rh Factor: POSITIVE
Rubella Antibodies, IGG: 32.8 index (ref >0.99)
WBC: 11.1 10*3/uL — ABNORMAL HIGH (ref 3.4–10.8)

## 2021-01-29 LAB — HEMOGLOBIN A1C
Est. average glucose Bld gHb Est-mCnc: 105 mg/dL
Hgb A1c MFr Bld: 5.3 % (ref 4.8–5.6)

## 2021-01-29 LAB — CERVICOVAGINAL ANCILLARY ONLY
Chlamydia: NEGATIVE
Comment: NEGATIVE
Comment: NORMAL
Neisseria Gonorrhea: NEGATIVE

## 2021-01-29 LAB — HCV INTERPRETATION

## 2021-01-29 LAB — BILE ACIDS, TOTAL: Bile Acids Total: 1.3 umol/L (ref 0.0–10.0)

## 2021-01-30 LAB — URINE CULTURE, OB REFLEX

## 2021-01-30 LAB — CULTURE, OB URINE

## 2021-02-17 ENCOUNTER — Encounter: Payer: Self-pay | Admitting: *Deleted

## 2021-02-19 ENCOUNTER — Encounter: Payer: Self-pay | Admitting: *Deleted

## 2021-03-04 ENCOUNTER — Other Ambulatory Visit: Payer: Self-pay

## 2021-03-04 ENCOUNTER — Ambulatory Visit (INDEPENDENT_AMBULATORY_CARE_PROVIDER_SITE_OTHER): Payer: Self-pay | Admitting: Obstetrics and Gynecology

## 2021-03-04 ENCOUNTER — Encounter: Payer: Self-pay | Admitting: Family Medicine

## 2021-03-04 VITALS — BP 110/66 | HR 67 | Wt 139.5 lb

## 2021-03-04 DIAGNOSIS — O0990 Supervision of high risk pregnancy, unspecified, unspecified trimester: Secondary | ICD-10-CM

## 2021-03-04 DIAGNOSIS — Z3A15 15 weeks gestation of pregnancy: Secondary | ICD-10-CM

## 2021-03-04 DIAGNOSIS — Z8719 Personal history of other diseases of the digestive system: Secondary | ICD-10-CM

## 2021-03-04 DIAGNOSIS — O219 Vomiting of pregnancy, unspecified: Secondary | ICD-10-CM

## 2021-03-04 DIAGNOSIS — Z8759 Personal history of other complications of pregnancy, childbirth and the puerperium: Secondary | ICD-10-CM

## 2021-03-04 MED ORDER — ONDANSETRON 4 MG PO TBDP
4.0000 mg | ORAL_TABLET | Freq: Four times a day (QID) | ORAL | 1 refills | Status: DC | PRN
  Filled 2021-03-04: qty 20, 5d supply, fill #0

## 2021-03-04 NOTE — Progress Notes (Signed)
   PRENATAL VISIT NOTE  Subjective:  Carrie Delacruz is a 30 y.o. G2P1 at [redacted]w[redacted]d being seen today for ongoing prenatal care.  She is currently monitored for the following issues for this low-risk pregnancy and has Supervision of high risk pregnancy, antepartum; [redacted] weeks gestation of pregnancy; History of cholestasis during pregnancy; [redacted] weeks gestation of pregnancy; and Nausea and vomiting in pregnancy prior to [redacted] weeks gestation on their problem list.  Patient doing well with no acute concerns today. She reports occasional dizziness.  Contractions: Not present. Vag. Bleeding: None.  Movement: Absent. Denies leaking of fluid. She denies any racing heart or palpitations when she is dizzy.  She notes mild nausea and vomiting and has started a new job.  The following portions of the patient's history were reviewed and updated as appropriate: allergies, current medications, past family history, past medical history, past social history, past surgical history and problem list. Problem list updated.  Objective:   Vitals:   03/04/21 0931  BP: 110/66  Pulse: 67  Weight: 139 lb 8 oz (63.3 kg)    Fetal Status: Fetal Heart Rate (bpm): 154   Movement: Absent     General:  Alert, oriented and cooperative. Patient is in no acute distress.  Skin: Skin is warm and dry. No rash noted.   Cardiovascular: Normal heart rate noted  Respiratory: Normal respiratory effort, no problems with respiration noted  Abdomen: Soft, gravid, appropriate for gestational age.  Pain/Pressure: Present     Pelvic: Cervical exam deferred        Extremities: Normal range of motion.  Edema: None  Mental Status:  Normal mood and affect. Normal behavior. Normal judgment and thought content.   Assessment and Plan:  Pregnancy: G2P1 at [redacted]w[redacted]d  1. Supervision of high risk pregnancy, antepartum Continue care, AFP at next visit  2. History of cholestasis during pregnancy Baseline labs WNL  3. [redacted] weeks gestation of  pregnancy   4. Nausea and vomiting in pregnancy prior to [redacted] weeks gestation  - ondansetron (ZOFRAN ODT) 4 MG disintegrating tablet; Take 1 tablet (4 mg total) by mouth every 6 (six) hours as needed for nausea.  Dispense: 20 tablet; Refill: 1  Preterm labor symptoms and general obstetric precautions including but not limited to vaginal bleeding, contractions, leaking of fluid and fetal movement were reviewed in detail with the patient.  Please refer to After Visit Summary for other counseling recommendations.   Return in about 4 weeks (around 04/01/2021) for ROB, in person.   Mariel Aloe, MD Faculty Attending Center for [facility]

## 2021-03-04 NOTE — Progress Notes (Signed)
Pt states has been nauseated & having dizziness for the last couple of days.

## 2021-03-11 ENCOUNTER — Other Ambulatory Visit: Payer: Self-pay

## 2021-03-31 ENCOUNTER — Encounter: Payer: Self-pay | Admitting: *Deleted

## 2021-03-31 ENCOUNTER — Other Ambulatory Visit: Payer: Self-pay | Admitting: *Deleted

## 2021-03-31 ENCOUNTER — Ambulatory Visit: Payer: Self-pay | Admitting: *Deleted

## 2021-03-31 ENCOUNTER — Ambulatory Visit: Payer: Self-pay | Attending: Obstetrics and Gynecology

## 2021-03-31 ENCOUNTER — Other Ambulatory Visit: Payer: Self-pay

## 2021-03-31 DIAGNOSIS — O0990 Supervision of high risk pregnancy, unspecified, unspecified trimester: Secondary | ICD-10-CM

## 2021-03-31 DIAGNOSIS — Z362 Encounter for other antenatal screening follow-up: Secondary | ICD-10-CM

## 2021-04-01 ENCOUNTER — Encounter: Payer: Self-pay | Admitting: Family Medicine

## 2021-04-17 ENCOUNTER — Other Ambulatory Visit: Payer: Self-pay

## 2021-04-17 ENCOUNTER — Encounter: Payer: Self-pay | Admitting: Family Medicine

## 2021-04-17 ENCOUNTER — Ambulatory Visit (INDEPENDENT_AMBULATORY_CARE_PROVIDER_SITE_OTHER): Payer: Self-pay | Admitting: Family Medicine

## 2021-04-17 VITALS — BP 114/75 | HR 84 | Wt 142.0 lb

## 2021-04-17 DIAGNOSIS — O0990 Supervision of high risk pregnancy, unspecified, unspecified trimester: Secondary | ICD-10-CM

## 2021-04-17 NOTE — Patient Instructions (Signed)
Lactancia materna  Breastfeeding    Decidir amamantar es una de las mejores elecciones que puede hacer por usted y su bebé. Un cambio en las hormonas durante el embarazo hace que las mamas produzcan leche materna en las glándulas productoras de leche. Las hormonas impiden que la leche materna sea liberada antes del nacimiento del bebé. Además, impulsan el flujo de leche luego del nacimiento. Una vez que ha comenzado a amamantar, pensar en el bebé, así como la succión o el llanto, pueden estimular la liberación de leche de las glándulas productoras de leche.  Los beneficios de amamantar  Las investigaciones demuestran que la lactancia materna ofrece muchos beneficios de salud para bebés y madres. Además, ofrece una forma gratuita y conveniente de alimentar al bebé.  Para el bebé  · La primera leche (calostro) ayuda a mejorar el funcionamiento del aparato digestivo del bebé.  · Las células especiales de la leche (anticuerpos) ayudan a combatir las infecciones en el bebé.  · Los bebés que se alimentan con leche materna también tienen menos probabilidades de tener asma, alergias, obesidad o diabetes de tipo 2. Además, tienen menor riesgo de sufrir el síndrome de muerte súbita del lactante (SMSL).  · Los nutrientes de la leche materna son mejores para satisfacer las necesidades del bebé en comparación con la leche maternizada.  · La leche materna mejora el desarrollo cerebral del bebé.  Para usted  · La lactancia materna favorece el desarrollo de un vínculo muy especial entre la madre y el bebé.  · Es conveniente. La leche materna es económica y siempre está disponible a la temperatura correcta.  · La lactancia materna ayuda a quemar calorías. Le ayuda a perder el peso ganado durante el embarazo.  · Hace que el útero vuelva al tamaño que tenía antes del embarazo más rápido. Además, disminuye el sangrado (loquios) después del parto.  · La lactancia materna contribuye a reducir el riesgo de tener diabetes de tipo 2,  osteoporosis, artritis reumatoide, enfermedades cardiovasculares y cáncer de mama, ovario, útero y endometrio en el futuro.  Información básica sobre la lactancia  Comienzo de la lactancia  · Encuentre un lugar cómodo para sentarse o acostarse, con un buen respaldo para el cuello y la espalda.  · Coloque una almohada o una manta enrollada debajo del bebé para acomodarlo a la altura de la mama (si está sentada). Las almohadas para amamantar se han diseñado especialmente a fin de servir de apoyo para los brazos y el bebé mientras amamanta.  · Asegúrese de que la barriga del bebé (abdomen) esté frente a la suya.  · Masajee suavemente la mama. Con las yemas de los dedos, masajee los bordes exteriores de la mama hacia adentro, en dirección al pezón. Esto estimula el flujo de leche. Si la leche fluye lentamente, es posible que deba continuar con este movimiento durante la lactancia.  · Sostenga la mama con 4 dedos por debajo y el pulgar por arriba del pezón (forme la letra “C” con la mano). Asegúrese de que los dedos se encuentren lejos del pezón y de la boca del bebé.  · Empuje suavemente los labios del bebé con el pezón o con el dedo.  · Cuando la boca del bebé se abra lo suficiente, acérquelo rápidamente a la mama e introduzca todo el pezón y la aréola, tanto como sea posible, dentro de la boca del bebé. La aréola es la zona de color que rodea al pezón.  ? Debe haber más aréola visible por arriba   del labio superior del bebé que por debajo del labio inferior.  ? Los labios del bebé deben estar abiertos y extendidos hacia afuera (evertidos) para asegurar que el bebé se prenda de forma adecuada y cómoda.  ? La lengua del bebé debe estar entre la encía inferior y la mama.  · Asegúrese de que la boca del bebé esté en la posición correcta alrededor del pezón (prendido). Los labios del bebé deben crear un sello sobre la mama y estar doblados hacia afuera (invertidos).  · Es común que el bebé succione durante 2 a 3 minutos  para que comience el flujo de leche materna.  Cómo debe prenderse  Es muy importante que le enseñe al bebé cómo prenderse adecuadamente a la mama. Si el bebé no se prende adecuadamente, puede causar dolor en los pezones, reducir la producción de leche materna y hacer que el bebé tenga un escaso aumento de peso. Además, si el bebé no se prende adecuadamente al pezón, puede tragar aire durante la alimentación. Esto puede causarle molestias al bebé. Hacer eructar al bebé al cambiar de mama puede ayudarlo a liberar el aire. Sin embargo, enseñarle al bebé cómo prenderse a la mama adecuadamente es la mejor manera de evitar que se sienta molesto por tragar aire mientras se alimenta.  Signos de que el bebé se ha prendido adecuadamente al pezón  · Tironea o succiona de modo silencioso, sin causarle dolor. Los labios del bebé deben estar extendidos hacia afuera (evertidos).  · Se escucha que traga cada 3 o 4 succiones una vez que la leche ha comenzado a fluir (después de que se produzca el reflejo de eyección de la leche).  · Hay movimientos musculares por arriba y por delante de sus oídos al succionar.  Signos de que el bebé no se ha prendido adecuadamente al pezón  · Hace ruidos de succión o de chasquido mientras se alimenta.  · Siente dolor en los pezones.  Si cree que el bebé no se prendió correctamente, deslice el dedo en la comisura de la boca y colóquelo entre las encías del bebé para interrumpir la succión. Intente volver a comenzar a amamantar.  Signos de lactancia materna exitosa  Signos del bebé  · El bebé disminuirá gradualmente el número de succiones o dejará de succionar por completo.  · El bebé se quedará dormido.  · El cuerpo del bebé se relajará.  · El bebé retendrá una pequeña cantidad de leche en la boca.  · El bebé se desprenderá solo del pecho.  Signos que presenta usted  · Las mamas han aumentado la firmeza, el peso y el tamaño 1 a 3 horas después de amamantar.  · Están más blandas inmediatamente después  de amamantar.  · Se producen un aumento del volumen de leche y un cambio en su consistencia y color hacia el quinto día de lactancia.  · Los pezones no duelen, no están agrietados ni sangran.  Signos de que su bebé recibe la cantidad de leche suficiente  · Mojar por lo menos 1 o 2 pañales durante las primeras 24 horas después del nacimiento.  · Mojar por lo menos 5 o 6 pañales cada 24 horas durante la primera semana después del nacimiento. La orina debe ser clara o de color amarillo pálido a los 5 días de vida.  · Mojar entre 6 y 8 pañales cada 24 horas a medida que el bebé sigue creciendo y desarrollándose.  · Defeca por lo menos 3 veces en 24 horas a los 5 días de vida. Las heces   deben ser blandas y amarillentas.  · Defeca por lo menos 3 veces en 24 horas a los 7 días de vida. Las heces deben ser grumosas y amarillentas.  · No registra una pérdida de peso mayor al 10 % del peso al nacer durante los primeros 3 días de vida.  · Aumenta de peso un promedio de 4 a 7 onzas (113 a 198 g) por semana después de los 4 días de vida.  · Aumenta de peso, diariamente, de manera uniforme a partir de los 5 días de vida, sin registrar pérdida de peso después de las 2 semanas de vida.  Después de alimentarse, es posible que el bebé regurgite una pequeña cantidad de leche. Esto es normal.  Frecuencia y duración de la lactancia  El amamantamiento frecuente la ayudará a producir más leche y puede prevenir dolores en los pezones y las mamas extremadamente llenas (congestión mamaria). Alimente al bebé cuando muestre signos de hambre o si siente la necesidad de reducir la congestión de las mamas. Esto se denomina "lactancia a demanda". Las señales de que el bebé tiene hambre incluyen las siguientes:  · Aumento del estado de alerta, actividad o inquietud.  · Mueve la cabeza de un lado a otro.  · Abre la boca cuando se le toca la mejilla o la comisura de la boca (reflejo de búsqueda).  · Aumenta las vocalizaciones, tales como sonidos de  succión, se relame los labios, emite arrullos, suspiros o chirridos.  · Mueve la mano hacia la boca y se chupa los dedos o las manos.  · Está molesto o llora.  Evite el uso del chupete en las primeras 4 a 6 semanas después del nacimiento del bebé. Después de este período, podrá usar un chupete. Las investigaciones demostraron que el uso del chupete durante el primer año de vida del bebé disminuye el riesgo de tener el síndrome de muerte súbita del lactante (SMSL).  Permita que el niño se alimente en cada mama todo lo que desee. Cuando el bebé se desprende o se queda dormido mientras se está alimentando de la primera mama, ofrézcale la segunda. Debido a que, con frecuencia, los recién nacidos están somnolientos las primeras semanas de vida, es posible que deba despertar al bebé para alimentarlo.  Los horarios de lactancia varían de un bebé a otro. Sin embargo, las siguientes reglas pueden servir como guía para ayudarla a garantizar que el bebé se alimenta adecuadamente:  · Se puede amamantar a los recién nacidos (bebés de 4 semanas o menos de vida) cada 1 a 3 horas.  · No deben transcurrir más de 3 horas durante el día o 5 horas durante la noche sin que se amamante a los recién nacidos.  · Debe amamantar al bebé un mínimo de 8 veces en un período de 24 horas.  Extracción de leche materna         La extracción y el almacenamiento de la leche materna le permiten asegurarse de que el bebé se alimente exclusivamente de su leche materna, aun en momentos en los que no puede amamantar. Esto tiene especial importancia si debe regresar al trabajo en el período en que aún está amamantando o si no puede estar presente en los momentos en que el bebé debe alimentarse. Su asesor en lactancia puede ayudarla a encontrar un método de extracción que funcione mejor para usted y orientarla sobre cuánto tiempo es seguro almacenar leche materna.  Cómo cuidar las mamas durante la lactancia  Los pezones pueden secarse, agrietarse y doler  durante   la lactancia. Las siguientes recomendaciones pueden ayudarla a mantener las mamas humectadas y sanas:  · Evite usar jabón en los pezones.  · Use un sostén de soporte diseñado especialmente para la lactancia materna. Evite usar sostenes con aro o sostenes muy ajustados (sostenes deportivos).  · Seque al aire sus pezones durante 3 a 4 minutos después de amamantar al bebé.  · Utilice solo apósitos de algodón en el sostén para absorber las pérdidas de leche. La pérdida de un poco de leche materna entre las tomas es normal.  · Utilice lanolina sobre los pezones luego de amamantar. La lanolina ayuda a mantener la humedad normal de la piel. La lanolina pura no es perjudicial (no es tóxica) para el bebé. Además, puede extraer manualmente algunas gotas de leche materna y masajear suavemente esa leche sobre los pezones para que la leche se seque al aire.  Durante las primeras semanas después del nacimiento, algunas mujeres experimentan congestión mamaria. La congestión mamaria puede hacer que sienta las mamas pesadas, calientes y sensibles al tacto. El pico de la congestión mamaria ocurre en el plazo de los 3 a 5 días después del parto. Las siguientes recomendaciones pueden ayudarla a aliviar la congestión mamaria:  · Vacíe por completo las mamas al amamantar o extraer leche. Puede aplicar calor húmedo en las mamas (en la ducha o con toallas húmedas para manos) antes de amamantar o extraer leche. Esto aumenta la circulación y ayuda a que la leche fluya. Si el bebé no vacía por completo las mamas cuando lo amamanta, extraiga la leche restante después de que haya finalizado.  · Aplique compresas de hielo sobre las mamas inmediatamente después de amamantar o extraer leche, a menos que le resulte demasiado incómodo. Haga lo siguiente:  ? Ponga el hielo en una bolsa plástica.  ? Coloque una toalla entre la piel y la bolsa de hielo.  ? Coloque el hielo durante 20 minutos, 2 o 3 veces por día.  · Asegúrese de que el bebé  esté prendido y se encuentre en la posición correcta mientras lo alimenta.  Si la congestión mamaria persiste luego de 48 horas o después de seguir estas recomendaciones, comuníquese con su médico o un asesor en lactancia.  Recomendaciones de salud general durante la lactancia  · Consuma 3 comidas y 3 colaciones saludables todos los días. Las madres bien alimentadas que amamantan necesitan entre 450 y 500 calorías adicionales por día. Puede cumplir con este requisito al aumentar la cantidad de una dieta equilibrada que realice.  · Beba suficiente agua para mantener la orina clara o de color amarillo pálido.  · Descanse con frecuencia, relájese y siga tomando sus vitaminas prenatales para prevenir la fatiga, el estrés y los niveles bajos de vitaminas y minerales en el cuerpo (deficiencias de nutrientes).  · No consuma ningún producto que contenga nicotina o tabaco, como cigarrillos y cigarrillos electrónicos. El bebé puede verse afectado por las sustancias químicas de los cigarrillos que pasan a la leche materna y por la exposición al humo ambiental del tabaco. Si necesita ayuda para dejar de fumar, consulte al médico.  · Evite el consumo de alcohol.  · No consuma drogas ilegales o marihuana.  · Antes de usar cualquier medicamento, hable con el médico. Estos incluyen medicamentos recetados y de venta libre, como también vitaminas y suplementos a base de hierbas. Algunos medicamentos, que pueden ser perjudiciales para el bebé, pueden pasar a través de la leche materna.  · Puede quedar embarazada durante la lactancia. Si se desea un método   anticonceptivo, consulte al médico sobre cuáles son las opciones seguras durante la lactancia.  Dónde encontrar más información:  Liga internacional [place]eche: www.llli.org.  Comuníquese con un médico si:  · Siente que quiere dejar de amamantar o se siente frustrada con la lactancia.  · Sus pezones están agrietados o sangran.  · Sus mamas están irritadas, sensibles o  calientes.  · Tiene los siguientes síntomas:  ? Dolor en las mamas o en los pezones.  ? Un área hinchada en cualquiera de las mamas.  ? Fiebre o escalofríos.  ? Náuseas o vómitos.  ? Drenaje de otro líquido distinto de la leche materna desde los pezones.  · Sus mamas no se llenan antes de amamantar al bebé para el quinto día después del parto.  · Se siente triste y deprimida.  · El bebé:  ? Está demasiado somnoliento como para comer bien.  ? Tiene problemas para dormir.  ? Tiene más de 1 semana de vida y moja menos de 6 pañales en un periodo de 24 horas.  ? No ha aumentado de peso a los 5 días de vida.  · El bebé defeca menos de 3 veces en 24 horas.  · La piel del bebé o las partes blancas de los ojos se vuelven amarillentas.  Solicite ayuda de inmediato si:  · El bebé está muy cansado (letargo) y no se quiere despertar para comer.  · Le sube la fiebre sin causa.  Resumen  · La lactancia materna ofrece muchos beneficios de salud para bebés y madres.  · Intente amamantar a su bebé cuando muestre signos tempranos de hambre.  · Haga cosquillas o empuje suavemente los labios del bebé con el dedo o el pezón para lograr que el bebé abra la boca. Acerque el bebé a la mama. Asegúrese de que la mayor parte de la aréola se encuentre dentro de la boca del bebé. Ofrézcale una mama y haga eructar al bebé antes de pasar a la otra.  · Hable con su médico o asesor en lactancia si tiene dudas o problemas con la lactancia.  Esta información no tiene como fin reemplazar el consejo del médico. Asegúrese de hacerle al médico cualquier pregunta que tenga.  Document Revised: 01/26/2018 Document Reviewed: 02/21/2017  Elsevier Patient Education © 2021 [employer]

## 2021-04-17 NOTE — Progress Notes (Signed)
   PRENATAL VISIT NOTE  Subjective:  Carrie Delacruz is a 30 y.o. G2P1 at [redacted]w[redacted]d being seen today for ongoing prenatal care.  She is currently monitored for the following issues for this low-risk pregnancy and has Supervision of high risk pregnancy, antepartum; History of cholestasis during pregnancy; and Nausea and vomiting in pregnancy prior to [redacted] weeks gestation on their problem list.  Patient reports no complaints.  Contractions: Not present. Vag. Bleeding: None.  Movement: Present. Denies leaking of fluid.   The following portions of the patient's history were reviewed and updated as appropriate: allergies, current medications, past family history, past medical history, past social history, past surgical history and problem list.   Objective:   Vitals:   04/17/21 0903  BP: 114/75  Pulse: 84  Weight: 142 lb (64.4 kg)    Fetal Status: Fetal Heart Rate (bpm): 144 Fundal Height: 24 cm Movement: Present     General:  Alert, oriented and cooperative. Patient is in no acute distress.  Skin: Skin is warm and dry. No rash noted.   Cardiovascular: Normal heart rate noted  Respiratory: Normal respiratory effort, no problems with respiration noted  Abdomen: Soft, gravid, appropriate for gestational age.  Pain/Pressure: Present     Pelvic: Cervical exam deferred        Extremities: Normal range of motion.  Edema: None  Mental Status: Normal mood and affect. Normal behavior. Normal judgment and thought content.   Assessment and Plan:  Pregnancy: G2P1 at [redacted]w[redacted]d 1. Supervision of high risk pregnancy, antepartum Continue routine prenatal care. 28 wk labs next visit.  Preterm labor symptoms and general obstetric precautions including but not limited to vaginal bleeding, contractions, leaking of fluid and fetal movement were reviewed in detail with the patient. Please refer to After Visit Summary for other counseling recommendations.   Return in 4 weeks (on 05/15/2021) for [facility],  in person, 28 wk labs.  Future Appointments  Date Time Provider Department Center  04/27/2021  3:30 PM [facility] NURSE [facility]  04/27/2021  3:45 PM [facility]  05/15/2021  8:20 AM [facility] LAB [facility]  05/15/2021  8:55 AM Anyanwu, Jethro Bastos, MD [facility]    Reva Bores, MD

## 2021-04-27 ENCOUNTER — Ambulatory Visit: Payer: Self-pay

## 2021-04-29 ENCOUNTER — Ambulatory Visit: Payer: Self-pay | Attending: Obstetrics

## 2021-04-29 ENCOUNTER — Ambulatory Visit: Payer: Self-pay | Admitting: *Deleted

## 2021-04-29 ENCOUNTER — Other Ambulatory Visit: Payer: Self-pay

## 2021-04-29 ENCOUNTER — Encounter: Payer: Self-pay | Admitting: *Deleted

## 2021-04-29 VITALS — BP 111/64 | HR 84

## 2021-04-29 DIAGNOSIS — Z362 Encounter for other antenatal screening follow-up: Secondary | ICD-10-CM | POA: Insufficient documentation

## 2021-04-29 DIAGNOSIS — Z3A26 26 weeks gestation of pregnancy: Secondary | ICD-10-CM

## 2021-04-29 DIAGNOSIS — O09892 Supervision of other high risk pregnancies, second trimester: Secondary | ICD-10-CM

## 2021-04-29 DIAGNOSIS — O0990 Supervision of high risk pregnancy, unspecified, unspecified trimester: Secondary | ICD-10-CM | POA: Insufficient documentation

## 2021-05-14 ENCOUNTER — Other Ambulatory Visit: Payer: Self-pay

## 2021-05-14 DIAGNOSIS — O0990 Supervision of high risk pregnancy, unspecified, unspecified trimester: Secondary | ICD-10-CM

## 2021-05-15 ENCOUNTER — Other Ambulatory Visit: Payer: Self-pay

## 2021-05-15 ENCOUNTER — Encounter: Payer: Self-pay | Admitting: Obstetrics & Gynecology

## 2021-05-15 ENCOUNTER — Ambulatory Visit (INDEPENDENT_AMBULATORY_CARE_PROVIDER_SITE_OTHER): Payer: Self-pay | Admitting: Obstetrics & Gynecology

## 2021-05-15 VITALS — BP 102/65 | HR 66 | Wt 144.4 lb

## 2021-05-15 DIAGNOSIS — Z8719 Personal history of other diseases of the digestive system: Secondary | ICD-10-CM

## 2021-05-15 DIAGNOSIS — O0990 Supervision of high risk pregnancy, unspecified, unspecified trimester: Secondary | ICD-10-CM

## 2021-05-15 DIAGNOSIS — Z3A28 28 weeks gestation of pregnancy: Secondary | ICD-10-CM

## 2021-05-15 DIAGNOSIS — O99713 Diseases of the skin and subcutaneous tissue complicating pregnancy, third trimester: Secondary | ICD-10-CM

## 2021-05-15 DIAGNOSIS — L299 Pruritus, unspecified: Secondary | ICD-10-CM | POA: Insufficient documentation

## 2021-05-15 DIAGNOSIS — Z23 Encounter for immunization: Secondary | ICD-10-CM

## 2021-05-15 DIAGNOSIS — Z8759 Personal history of other complications of pregnancy, childbirth and the puerperium: Secondary | ICD-10-CM

## 2021-05-15 HISTORY — DX: Pruritus, unspecified: L29.9

## 2021-05-15 MED ORDER — HYDROXYZINE HCL 25 MG PO TABS
25.0000 mg | ORAL_TABLET | Freq: Four times a day (QID) | ORAL | 2 refills | Status: DC | PRN
  Filled 2021-05-15: qty 60, 8d supply, fill #0

## 2021-05-15 NOTE — Patient Instructions (Signed)
[email or web address]">  Tercer trimestre de Psychiatrist Third Trimester of Pregnancy  El tercer trimestre de embarazo va desde la semana 28 hasta la semana 40. Esto corresponde a los meses 7 a 9. El tercer trimestre es un perodo en el que el beb en gestacin (feto) crece rpidamente. Hacia el final del noveno mes, el feto mide alrededor de20 pulgadas (45 cm) de largo y pesa entre 6 y 10 libras (2.7 y 4.5 kg). Cambios en el cuerpo durante el tercer trimestre Durante el tercer trimestre, su cuerpo contina experimentando numerosos cambios. Los cambios varan y generalmente vuelven a la normalidad despus delnacimiento del beb. Cambios fsicos Seguir [employer]. Es de esperar que aumente entre 25 y 35 libras (11 y 16 kg) hacia el final del Psychiatrist si inicia el Psychiatrist con un peso normal. Si tiene bajo [place], es de esperar que aumente entre 28 y 40 libras (13 y 18 kg), y si tiene sobrepeso, es de esperar que aumente entre 15 y 25 libras (7 y 11 kg). Podrn aparecer las primeras [employer] caderas, el abdomen y las [place]. Las [employer] seguirn creciendo y Writer. Un lquido amarillo Charity fundraiser) puede salir de sus pechos. Esta es la primera leche que usted produce para su beb. Tal vez haya cambios en el cabello. Esto cambios pueden incluir su engrosamiento, crecimiento rpido y [employer] textura. A algunas personas tambin se les cae el cabello durante o despus del [place], o tienen el cabello seco o fino. El ombligo puede salir hacia afuera. Puede observar que se le [employer] and [employer], el rostro o los tobillos. Cambios en la salud Es posible que tenga acidez estomacal. Puede sufrir estreimiento. Puede desarrollar hemorroides. Puede desarrollar venas hinchadas y abultadas (venas varicosas) en las piernas. Puede presentar ms dolor en la pelvis, la espalda o los muslos. Esto se debe al [employer] de peso y al aumento de las hormonas  que relajan las articulaciones. Puede presentar un aumento del hormigueo o entumecimiento en las manos, brazos y piernas. La piel de su abdomen tambin puede sentirse entumecida. Puede sentir que le falta el aire debido a que se expande el tero. Otros cambios Puede tener necesidad de Geographical information systems officer con ms frecuencia porque el feto baja hacia la pelvis y ejerce presin sobre la vejiga. Puede tener ms problemas para dormir. Esto puede deberse al tamao de su abdomen, una mayor necesidad de orinar y un aumento en el metabolismo de su cuerpo. Puede notar que el feto "baja" o lo siente ms bajo, en el abdomen (aligeramiento). Puede tener un aumento de la secrecin vaginal. Puede notar que tiene dolor alrededor del hueso plvico a medida que el tero se distiende. Siga estas instrucciones en su casa: Medicamentos Siga las instrucciones del mdico en relacin con el uso de medicamentos. Durante el embarazo, hay medicamentos que pueden tomarse y otros que no. No tome ningn medicamento a menos que lo haya autorizado el mdico. Tome vitaminas prenatales que contengan por lo menos 600 microgramos (mcg) de cido flico. Comida y bebida Lleve una dieta saludable que incluya frutas y verduras frescas, cereales integrales, buenas fuentes de protenas como carnes [place], huevos o tofu, y productos lcteos descremados. Evite la carne cruda y el [place], la [place] y el queso sin Market researcher. Estos portan grmenes que pueden provocar dao tanto a usted como al beb. Tome 4 o 5 comidas pequeas en lugar de 3 comidas abundantes al da. Es posible que tenga que tomar estas medidas para prevenir o Engineer, production  estreimiento: Neal Dy suficiente lquido como para mantener la orina de color amarillo plido. Consumir alimentos ricos en fibra, como frijoles, cereales integrales, y frutas y verduras frescas. Limitar el consumo de alimentos ricos en grasa y azcares procesados, como los alimentos fritos o dulces. Actividad Haga  ejercicio solamente como se lo haya indicado el mdico. La mayora de las personas pueden continuar su actividad fsica habitual durante el [place]. Intente realizar como mnimo 30 minutos de actividad fsica por lo menos 5 das a la [place]. Deje de hacer ejercicio si experimenta contracciones en el tero. Deje de hacer ejercicio si le aparecen dolor o clicos en la parte baja del vientre o de la espalda. Evite levantar pesos [employer]. No haga ejercicio si hace mucho calor o humedad, o si se encuentra a una altitud elevada. Si lo desea, puede seguir teniendo [employer] [place], salvo que el mdico le indique lo contrario. Alivio del dolor y del [place] pausas frecuentes y descanse con las piernas levantadas (elevadas) si tiene calambres en las piernas o dolor en la parte baja de la espalda. Dese baos de asiento con agua tibia para Engineer, materials o las molestias causadas por las hemorroides. Use una crema para las hemorroides si el mdico la autoriza. Use un sujetador que le brinde buen soporte para prevenir las molestias causadas por la sensibilidad en las [place]. Si tiene venas varicosas: Use medias de compresin como se lo haya indicado el mdico. Eleve los pies durante 15 minutos, 3 o 4 veces por da. Limite el consumo de sal en su dieta. Seguridad Hable con su mdico antes de viajar distancias largas. No se d baos de inmersin en agua caliente, baos turcos ni saunas. Use el cinturn de seguridad en todo momento mientras conduce o va en auto. Hable con el mdico si es vctima de [employer] o fsico. Preparacin para el nacimiento Para prepararse para la llegada de su beb: Tome clases prenatales para entender, Education administrator, y hacer preguntas sobre el [place] de parto y [place]. Visite el hospital y recorra el rea de maternidad. Compre un asiento de seguridad [employer], y asegrese de saber cmo instalarlo en su automvil. Prepare la habitacin o el lugar donde  dormir el beb. Asegrese de quitar todas las almohadas y [place] de peluche de la cuna del beb para evitar la asfixia. Indicaciones generales Evite el contacto con las bandejas sanitarias de los gatos y la tierra que estos animales usan. Estos alimentos contienen bacterias que pueden causar defectos congnitos en el beb. Si tiene Financial controller, pdale a alguien que limpie la caja de arena por usted. No se haga lavados vaginales ni use tampones. No use toallas higinicas perfumadas. No consuma ningn producto que contenga nicotina o tabaco, como cigarrillos, cigarrillos electrnicos y tabaco de Theatre manager. Si necesita ayuda para dejar de consumir estos productos, consulte al mdico. No use ningn remedio a base de hierbas, drogas ilegales o medicamentos que no le hayan sido recetados. Las sustancias qumicas de estos productos pueden daar al beb. No beba alcohol. Le realizarn exmenes prenatales ms frecuentes durante el tercer trimestre. Durante una visita prenatal de rutina, el mdico le har un examen fsico, Civil engineer, contracting pruebas y Heritage manager con usted de su salud general. Cumpla con todas las visitas de seguimiento. Esto es importante. Dnde buscar ms informacin American Pregnancy Association (Asociacin Estadounidense del Embarazo): americanpregnancy.org [employer] of Obstetricians and Gynecologists (Colegio Estadounidense de Obstetras y [place]): EmploymentAssurance.cz? Office on [employer] (Oficina para la Salud de la  Mujer): womenshealth.gov/pregnancy Comunquese con un mdico si tiene: Fiebre. Clicos leves en la pelvis, presin en la pelvis o dolor persistente en la zona abdominal o la parte baja de la espalda. Vmitos o diarrea. Secrecin vaginal con mal olor u orina con mal olor. Dolor al orinar. Un dolor de cabeza que no desaparece despus de tomar analgsicos. Cambios en la visin o ve manchas delante de los ojos. Solicite ayuda de inmediato si: Rompe la  bolsa. Tiene contracciones regulares separadas por menos de 5 minutos. Tiene sangrado o pequeas prdidas vaginales. Siente un dolor abdominal intenso. Tiene dificultad para respirar. Siente dolor en el pecho. Sufre episodios de desmayo. No ha sentido a su beb moverse durante el perodo de tiempo que le indic el mdico. Tiene dolor, hinchazn o enrojecimiento nuevos en un brazo o una pierna o se produce un aumento de alguno de estos sntomas. Resumen El tercer trimestre del embarazo comprende desde la semana 28 hasta la semana 40 (desde el mes 7 hasta el mes 9). Puede tener ms problemas para dormir. Esto puede deberse al tamao de su abdomen, una mayor necesidad de orinar y un aumento en el metabolismo de su cuerpo. Le realizarn exmenes prenatales ms frecuentes durante el tercer trimestre. Cumpla con todas las visitas de seguimiento. Esto es importante. Esta informacin no tiene como fin reemplazar el consejo del mdico. Asegresede hacerle al mdico cualquier pregunta que tenga. Document Revised: 05/09/2020 Document Reviewed: 05/09/2020 Elsevier Patient Education  2022 Elsevier Inc.  

## 2021-05-15 NOTE — Progress Notes (Signed)
   PRENATAL VISIT NOTE  Subjective:  Carrie Delacruz is a 30 y.o. G2P1 at [redacted]w[redacted]d being seen today for ongoing prenatal care.  Patient is Spanish-speaking only, interpreter present for this encounter. She is currently monitored for the following issues for this low-risk pregnancy and has Supervision of high risk pregnancy, antepartum; History of cholestasis during pregnancy; and Pruritus of pregnancy in third trimester on their problem list.  Patient reports  itching all over periodically. Had cholestasis last pregnancy .  Contractions: Not present. Vag. Bleeding: None.  Movement: Present. Denies leaking of fluid.   The following portions of the patient's history were reviewed and updated as appropriate: allergies, current medications, past family history, past medical history, past social history, past surgical history and problem list.   Objective:   Vitals:   05/15/21 0908  BP: 102/65  Pulse: 66  Weight: 144 lb 6.4 oz (65.5 kg)    Fetal Status: Fetal Heart Rate (bpm): 141 Fundal Height: 29 cm Movement: Present     General:  Alert, oriented and cooperative. Patient is in no acute distress.  Skin: Skin is warm and dry. No rash noted.   Cardiovascular: Normal heart rate noted  Respiratory: Normal respiratory effort, no problems with respiration noted  Abdomen: Soft, gravid, appropriate for gestational age.  Pain/Pressure: Present     Pelvic: Cervical exam deferred        Extremities: Normal range of motion.  Edema: None  Mental Status: Normal mood and affect. Normal behavior. Normal judgment and thought content.   Assessment and Plan:  Pregnancy: G2P1 at [redacted]w[redacted]d 1. Pruritus of pregnancy in third trimester 2. History of cholestasis during pregnancy Will check labs, Atarax prescribed prn - Comprehensive metabolic panel - Bile acids, total - hydrOXYzine (ATARAX/VISTARIL) 25 MG tablet; Take 1-2 tablets (25-50 mg total) by mouth every 6 (six) hours as needed for itching.   Dispense: 60 tablet; Refill: 2  3. Need for Tdap vaccination - Tdap vaccine greater than or equal to 7yo IM  4. [redacted] weeks gestation of pregnancy 5. Supervision of high risk pregnancy, antepartum Third trimester labs done today, will follow up results and manage accordingly. Nexplanon chosen for contraception.  Preterm labor symptoms and general obstetric precautions including but not limited to vaginal bleeding, contractions, leaking of fluid and fetal movement were reviewed in detail with the patient. Please refer to After Visit Summary for other counseling recommendations.   Return in about 2 weeks (around 05/29/2021) for OFFICE OB VISIT (MD only).  No future appointments.  Jaynie Collins, MD

## 2021-05-16 ENCOUNTER — Encounter: Payer: Self-pay | Admitting: Obstetrics & Gynecology

## 2021-05-16 DIAGNOSIS — O24419 Gestational diabetes mellitus in pregnancy, unspecified control: Secondary | ICD-10-CM

## 2021-05-16 HISTORY — DX: Gestational diabetes mellitus in pregnancy, unspecified control: O24.419

## 2021-05-16 LAB — RPR: RPR Ser Ql: NONREACTIVE

## 2021-05-16 LAB — HIV ANTIBODY (ROUTINE TESTING W REFLEX): HIV Screen 4th Generation wRfx: NONREACTIVE

## 2021-05-16 LAB — GLUCOSE TOLERANCE, 2 HOURS W/ 1HR
Glucose, 1 hour: 212 mg/dL — ABNORMAL HIGH (ref 65–179)
Glucose, 2 hour: 196 mg/dL — ABNORMAL HIGH (ref 65–152)
Glucose, Fasting: 86 mg/dL (ref 65–91)

## 2021-05-16 LAB — CBC
Hematocrit: 38.4 % (ref 34.0–46.6)
Hemoglobin: 12.9 g/dL (ref 11.1–15.9)
MCH: 29.7 pg (ref 26.6–33.0)
MCHC: 33.6 g/dL (ref 31.5–35.7)
MCV: 89 fL (ref 79–97)
Platelets: 303 10*3/uL (ref 150–450)
RBC: 4.34 x10E6/uL (ref 3.77–5.28)
RDW: 12.9 % (ref 11.7–15.4)
WBC: 8.3 10*3/uL (ref 3.4–10.8)

## 2021-05-17 ENCOUNTER — Encounter: Payer: Self-pay | Admitting: Obstetrics & Gynecology

## 2021-05-17 DIAGNOSIS — O26649 Intrahepatic cholestasis of pregnancy, unspecified trimester: Secondary | ICD-10-CM | POA: Insufficient documentation

## 2021-05-17 DIAGNOSIS — K831 Obstruction of bile duct: Secondary | ICD-10-CM

## 2021-05-17 HISTORY — DX: Intrahepatic cholestasis of pregnancy, unspecified trimester: O26.649

## 2021-05-17 HISTORY — DX: Obstruction of bile duct: K83.1

## 2021-05-17 LAB — COMPREHENSIVE METABOLIC PANEL
ALT: 24 IU/L (ref 0–32)
AST: 30 IU/L (ref 0–40)
Albumin/Globulin Ratio: 1.4 (ref 1.2–2.2)
Albumin: 3.6 g/dL — ABNORMAL LOW (ref 3.9–5.0)
Alkaline Phosphatase: 175 IU/L — ABNORMAL HIGH (ref 44–121)
BUN/Creatinine Ratio: 16 (ref 9–23)
BUN: 8 mg/dL (ref 6–20)
Bilirubin Total: 0.2 mg/dL (ref 0.0–1.2)
CO2: 19 mmol/L — ABNORMAL LOW (ref 20–29)
Calcium: 9.1 mg/dL (ref 8.7–10.2)
Chloride: 98 mmol/L (ref 96–106)
Creatinine, Ser: 0.49 mg/dL — ABNORMAL LOW (ref 0.57–1.00)
Globulin, Total: 2.6 g/dL (ref 1.5–4.5)
Glucose: 223 mg/dL — ABNORMAL HIGH (ref 65–99)
Potassium: 3.8 mmol/L (ref 3.5–5.2)
Sodium: 135 mmol/L (ref 134–144)
Total Protein: 6.2 g/dL (ref 6.0–8.5)
eGFR: 131 mL/min/{1.73_m2} (ref >59)

## 2021-05-17 LAB — BILE ACIDS, TOTAL: Bile Acids Total: 16.6 umol/L (ref 0.0–10.0)

## 2021-05-17 MED ORDER — URSODIOL 500 MG PO TABS
500.0000 mg | ORAL_TABLET | Freq: Two times a day (BID) | ORAL | 3 refills | Status: DC
  Filled 2021-05-17: qty 60, 30d supply, fill #0
  Filled 2021-06-18: qty 60, 30d supply, fill #1

## 2021-05-17 NOTE — Progress Notes (Signed)
Patient has cholestasis of pregnancy. Ursodiol prescribed.  Patient will need weekly BPP, this needs to be scheduled.  Induction of labor to be scheduled at 37 weeks or earlier for any other complications. Please call to inform patient of results and recommendations.

## 2021-05-17 NOTE — Addendum Note (Signed)
Addended by: Jaynie Collins A on: 05/17/2021 04:04 AM   Modules accepted: Orders

## 2021-05-19 ENCOUNTER — Other Ambulatory Visit: Payer: Self-pay

## 2021-05-19 ENCOUNTER — Telehealth: Payer: Self-pay | Admitting: *Deleted

## 2021-05-19 ENCOUNTER — Telehealth: Payer: Self-pay | Admitting: General Practice

## 2021-05-19 NOTE — Telephone Encounter (Signed)
-----   Message from Tereso Newcomer, MD sent at 05/17/2021  4:04 AM EDT ----- Patient has cholestasis of pregnancy. Ursodiol prescribed.  Patient will need weekly BPP, this needs to be scheduled.  Induction of labor to be scheduled at 37 weeks or earlier for any other complications. Please call to inform patient of results and recommendations.

## 2021-05-19 NOTE — Telephone Encounter (Signed)
Per Dr Macon Large, Patient's 2 hr GTT is abnormal and is consistent with Gestational Diabetes  [ ]  GDM education and testing supplies  [ ]  Nutrition consult  Please call to inform patient of results and recommendations.    Called patient with [facility] for spanish interpretation. Informed patient of all results, early induction, weekly testing, diabetes education, & medication information. Patient verbalized understanding to all and confirms availability to come 7/6 @ 215 for NST/BPP & 7/14 @ 315 for diabetes ed. Patient had no questions.

## 2021-05-19 NOTE — Telephone Encounter (Signed)
Pt called office and her call was routed to interpreter Lorinda Creed who then assisted with this encounter. She had questions about a new medication which was prescribed earlier today. Pt was concerned to know if the medication is expensive. I advised that it can be however, the pharmacy may be able to assist with a discount. Pt should notify the pharmacist when she picks it up that she does not have insurance. Pt voiced understanding.

## 2021-05-20 ENCOUNTER — Encounter: Payer: Self-pay | Admitting: Family Medicine

## 2021-05-20 ENCOUNTER — Ambulatory Visit (INDEPENDENT_AMBULATORY_CARE_PROVIDER_SITE_OTHER): Payer: Self-pay

## 2021-05-20 ENCOUNTER — Other Ambulatory Visit: Payer: Self-pay

## 2021-05-20 ENCOUNTER — Telehealth: Payer: Self-pay

## 2021-05-20 ENCOUNTER — Ambulatory Visit: Payer: Self-pay | Admitting: *Deleted

## 2021-05-20 VITALS — BP 108/69 | HR 84 | Wt 143.4 lb

## 2021-05-20 DIAGNOSIS — K831 Obstruction of bile duct: Secondary | ICD-10-CM

## 2021-05-20 DIAGNOSIS — O26613 Liver and biliary tract disorders in pregnancy, third trimester: Secondary | ICD-10-CM

## 2021-05-20 NOTE — Progress Notes (Signed)

## 2021-05-20 NOTE — Telephone Encounter (Signed)
Returned phone call; [facility] staff states this call was regarding labs drawn on 05/15/21. Bile Acids Total was 16.6. This has been reviewed by Macon Large, MD.

## 2021-05-22 ENCOUNTER — Telehealth: Payer: Self-pay | Admitting: *Deleted

## 2021-05-22 NOTE — Telephone Encounter (Signed)
Spoke with patient with Madelynn Done, interpreter.  Appointments given.  Patient states understanding and is agreeable.

## 2021-05-26 ENCOUNTER — Other Ambulatory Visit: Payer: Self-pay

## 2021-05-26 ENCOUNTER — Encounter: Payer: Self-pay | Admitting: Family Medicine

## 2021-05-26 ENCOUNTER — Ambulatory Visit (INDEPENDENT_AMBULATORY_CARE_PROVIDER_SITE_OTHER): Payer: Self-pay

## 2021-05-26 ENCOUNTER — Ambulatory Visit (INDEPENDENT_AMBULATORY_CARE_PROVIDER_SITE_OTHER): Payer: Self-pay | Admitting: Family Medicine

## 2021-05-26 ENCOUNTER — Ambulatory Visit (INDEPENDENT_AMBULATORY_CARE_PROVIDER_SITE_OTHER): Payer: Self-pay | Admitting: General Practice

## 2021-05-26 VITALS — BP 113/76 | HR 104

## 2021-05-26 DIAGNOSIS — O0990 Supervision of high risk pregnancy, unspecified, unspecified trimester: Secondary | ICD-10-CM

## 2021-05-26 DIAGNOSIS — O26619 Liver and biliary tract disorders in pregnancy, unspecified trimester: Secondary | ICD-10-CM

## 2021-05-26 DIAGNOSIS — K831 Obstruction of bile duct: Secondary | ICD-10-CM

## 2021-05-26 DIAGNOSIS — O24410 Gestational diabetes mellitus in pregnancy, diet controlled: Secondary | ICD-10-CM

## 2021-05-26 MED ORDER — LORATADINE 10 MG PO TABS
10.0000 mg | ORAL_TABLET | Freq: Every day | ORAL | 2 refills | Status: DC
  Filled 2021-05-26: qty 30, 30d supply, fill #0

## 2021-05-26 NOTE — Patient Instructions (Signed)
[email or web address]  Tercer trimestre de Psychiatrist Third Trimester of Pregnancy  El tercer trimestre de embarazo va desde la semana 28 hasta la semana 40. Esto corresponde a los meses 7 a 9. El tercer trimestre es un perodo en el que el beb en gestacin (feto) crece rpidamente. Hacia el final del noveno mes, el feto mide alrededor de20pulgadas (45cm) de largo y pesa entre 6 y 10 libras (2.7 y 4.5kg). Cambios en el cuerpo durante el tercer trimestre Durante el tercer trimestre, su cuerpo contina experimentando numerosos cambios. Los cambios varan y generalmente vuelven a la normalidad despus delnacimiento del beb. Cambios fsicos Seguir [employer]. Es de esperar que aumente entre 25 y 35libras (11 y 16kg) Psychiatric nurse final del embarazo si inicia el Psychiatrist con un peso normal. Si tiene bajo [place], es de esperar que aumente entre 28 y 40 libras (13 y18 kg), y si tiene sobrepeso, es de esperar que aumente entre 15 y 25 libras (7 y 11kg). Podrn aparecer las primeras Albertson's caderas, el abdomen y las [place]. Las [employer] Foods seguirn creciendo y Writer. Un lquido amarillo Charity fundraiser) puede salir de sus pechos. Esta es la primera leche que usted produce para su beb. Tal vez haya cambios en el cabello. Esto cambios pueden incluir su engrosamiento, crecimiento rpido y [employer] textura. A algunas personas tambin se les cae el cabello durante o despus del Road Runner, o tienen el cabello seco o fino. El ombligo puede salir hacia afuera. Puede observar que se le Eli Lilly and Company, el rostro o los tobillos. Cambios en la salud Es posible que tenga acidez estomacal. Puede sufrir estreimiento. Puede desarrollar hemorroides. Puede desarrollar venas hinchadas y abultadas (venas varicosas) en las piernas. Puede presentar ms dolor en la pelvis, la espalda o los muslos. Esto se debe al Citigroup de peso y al aumento de las hormonas  que relajan las articulaciones. Puede presentar un aumento del hormigueo o entumecimiento en las manos, brazos y piernas. La piel de su abdomen tambin puede sentirse entumecida. Puede sentir que le falta el aire debido a que se expande el tero. Otros cambios Puede tener necesidad de Geographical information systems officer con ms frecuencia porque el feto baja hacia la pelvis y ejerce presin sobre la vejiga. Puede tener ms problemas para dormir. Esto puede deberse al tamao de su abdomen, una mayor necesidad de orinar y un aumento en el metabolismo de su cuerpo. Puede notar que el feto "baja" o lo siente ms bajo, en el abdomen (aligeramiento). Puede tener un aumento de la secrecin vaginal. Puede notar que tiene dolor alrededor del hueso plvico a medida que el tero se distiende. Siga estas instrucciones en su casa: Medicamentos Siga las instrucciones del mdico en relacin con el uso de medicamentos. Durante el embarazo, hay medicamentos que pueden tomarse y otros que no. No tome ningn medicamento a menos que lo haya autorizado el mdico. Tome vitaminas prenatales que contengan por lo menos (mcg) de cido flico. Comida y bebida Lleve una dieta saludable que incluya frutas y verduras frescas, cereales integrales, buenas fuentes de protenas como carnes Ulen, huevos o tofu, y productos lcteos descremados. Evite la carne cruda y el [place], la [place] y el queso sin Market researcher. Estos portan grmenes que pueden provocar dao tanto a usted como al beb. Tome 4 o 5 comidas pequeas en lugar de 3 comidas abundantes al da. Es posible que tenga que tomar estas medidas para prevenir o tratar el estreimiento: Product manager suficiente lquido como para Photographer  estreimiento: Beber suficiente lquido como para mantener la orina de color amarillo plido. Consumir alimentos ricos en fibra, como frijoles, cereales integrales, y frutas y verduras frescas. Limitar el consumo de alimentos ricos en grasa y azcares procesados, como los alimentos fritos o dulces. Actividad Haga  ejercicio solamente como se lo haya indicado el mdico. La mayora de las personas pueden continuar su actividad fsica habitual durante el embarazo. Intente realizar como mnimo 30 minutos de actividad fsica por lo menos 5 das a la semana. Deje de hacer ejercicio si experimenta contracciones en el tero. Deje de hacer ejercicio si le aparecen dolor o clicos en la parte baja del vientre o de la espalda. Evite levantar pesos excesivos. No haga ejercicio si hace mucho calor o humedad, o si se encuentra a una altitud elevada. Si lo desea, puede seguir teniendo relaciones sexuales, salvo que el mdico le indique lo contrario. Alivio del dolor y del malestar Haga pausas frecuentes y descanse con las piernas levantadas (elevadas) si tiene calambres en las piernas o dolor en la parte baja de la espalda. Dese baos de asiento con agua tibia para aliviar el dolor o las molestias causadas por las hemorroides. Use una crema para las hemorroides si el mdico la autoriza. Use un sujetador que le brinde buen soporte para prevenir las molestias causadas por la sensibilidad en las mamas. Si tiene venas varicosas: Use medias de compresin como se lo haya indicado el mdico. Eleve los pies durante 15 minutos, 3 o 4 veces por da. Limite el consumo de sal en su dieta. Seguridad Hable con su mdico antes de viajar distancias largas. No se d baos de inmersin en agua caliente, baos turcos ni saunas. Use el cinturn de seguridad en todo momento mientras conduce o va en auto. Hable con el mdico si es vctima de maltrato verbal o fsico. Preparacin para el nacimiento Para prepararse para la llegada de su beb: Tome clases prenatales para entender, practicar, y hacer preguntas sobre el trabajo de parto y el parto. Visite el hospital y recorra el rea de maternidad. Compre un asiento de seguridad orientado hacia atrs, y asegrese de saber cmo instalarlo en su automvil. Prepare la habitacin o el lugar donde  dormir el beb. Asegrese de quitar todas las almohadas y animales de peluche de la cuna del beb para evitar la asfixia. Indicaciones generales Evite el contacto con las bandejas sanitarias de los gatos y la tierra que estos animales usan. Estos alimentos contienen bacterias que pueden causar defectos congnitos en el beb. Si tiene un gato, pdale a alguien que limpie la caja de arena por usted. No se haga lavados vaginales ni use tampones. No use toallas higinicas perfumadas. No consuma ningn producto que contenga nicotina o tabaco, como cigarrillos, cigarrillos electrnicos y tabaco de mascar. Si necesita ayuda para dejar de consumir estos productos, consulte al mdico. No use ningn remedio a base de hierbas, drogas ilegales o medicamentos que no le hayan sido recetados. Las sustancias qumicas de estos productos pueden daar al beb. No beba alcohol. Le realizarn exmenes prenatales ms frecuentes durante el tercer trimestre. Durante una visita prenatal de rutina, el mdico le har un examen fsico, le realizar pruebas y hablar con usted de su salud general. Cumpla con todas las visitas de seguimiento. Esto es importante. Dnde buscar ms informacin American Pregnancy Association (Asociacin Estadounidense del Embarazo): americanpregnancy.org American College of Obstetricians and Gynecologists (Colegio Estadounidense de Obstetras y Gineclogos): acog.org/womens-health/pregnancy? Office on Women's Health (Oficina para la Salud de la   la pelvis, presin en la pelvis o dolor persistente en la zona abdominal o la parte baja de la espalda. Vmitos o diarrea. Secrecin vaginal con mal olor u orina con mal olor. Dolor al Beatrix Shipper. Un dolor de cabeza que no desaparece despus de Science writer. Cambios en la visin o ve manchas delante de los ojos. Solicite ayuda de inmediato si: Rompe la  bolsa. Tiene contracciones regulares separadas por menos de . Tiene sangrado o pequeas prdidas vaginales. Siente un dolor abdominal intenso. Tiene dificultad para respirar. Siente dolor en el pecho. Sufre episodios de [employer]. No ha sentido a su beb moverse durante el perodo de [employer] Energy indic el mdico. Tiene dolor, hinchazn o enrojecimiento nuevos en un brazo o una pierna o se produce un aumento de alguno de estos sntomas. Resumen El tercer trimestre del Psychiatrist comprende desde la semana28 hasta la semana 40 (desde el mes7 hasta el mes9). Puede tener ms problemas para dormir. Esto puede deberse al tamao de su abdomen, una mayor necesidad de orinar y un aumento en el metabolismo de su cuerpo. Le realizarn exmenes prenatales ms frecuentes durante el tercer trimestre. Cumpla con todas las visitas de seguimiento. Esto es importante. Esta informacin no tiene Theme park manager el consejo del mdico. Asegresede hacerle al mdico cualquier pregunta que tenga. Document Revised: 05/09/2020 Document Reviewed: 05/09/2020 Elsevier Patient Education  2022 Elsevier Inc.  [employer] del mtodo anticonceptivo Contraception Choices La anticoncepcin, o los mtodos anticonceptivos, hace referencia a los mtodoso dispositivos que evitan el [place]. Mtodos hormonales  Implante anticonceptivo Un implante anticonceptivo consiste en un tubo delgado de plstico que contiene una hormona que evita el [place]. Es diferente de un dispositivo intrauterino (DIU). Un mdico lo inserta en la parte superior del brazo. Los implantespueden ser eficaces durante un mximo de 3 aos. Inyecciones de progestina sola Las inyecciones de progestina sola contienen progestina, una forma sinttica dela hormona progesterona. Un mdico las administra cada 3 meses. Pldoras anticonceptivas Las pldoras anticonceptivas son pastillas que contienen hormonas que evitan el [place]. Deben tomarse una vez al  da, preferentemente a la misma hora cadada. Se necesita una receta para utilizar este mtodo anticonceptivo. Parche anticonceptivo El parche anticonceptivo contiene hormonas que evitan el [place]. Se coloca en la piel, debe cambiarse una vez a la semana durante tres semanas y debe retirarse en la cuarta semana. Se necesita una receta para Scientific laboratory technician. Anillo vaginal Un anillo vaginal contiene hormonas que evitan el embarazo. Se coloca en la vagina durante tres semanas y se retira en la cuarta semana. Luego se repite el proceso con un anillo nuevo. Se necesita una receta para Scientific laboratory technician. Anticonceptivo de emergencia Los anticonceptivos de emergencia son mtodos para evitar un embarazo despus de Warehouse manager sexo sin proteccin. Vienen en forma de pldora y pueden tomarse hasta 5 das despus de [place]. Funcionan mejor cuando se toman lo ms pronto posible luego de eBay. La mayora de los anticonceptivos de emergencia estn disponibles sin receta mdica. Este mtodo no debe utilizarse como elnico mtodo anticonceptivo. Mtodos de barrera  Condn masculino Un condn masculino es una vaina delgada que se coloca sobre el pene durante el sexo. Los condones evitan que el esperma ingrese en el cuerpo de la [employer]. Pueden utilizarse con un una sustancia que mata a los espermatozoides (espermicida) para aumentar la efectividad. Deben desecharse despus de un uso. Condn femenino Un condn femenino es una vaina blanda y holgada que se coloca en la vagina antes de [place]  sexo. El condn evita que el esperma ingrese en el cuerpo de Insurance claims handler. Deben desecharse despus de un uso. Diafragma Un diafragma es una barrera blanda con forma de cpula. Se inserta en la vagina antes del sexo, junto con un espermicida. El diafragma bloquea el ingreso de esperma en el tero, y el espermicida mata a los espermatozoides. El Designer, fashion/clothing en la vagina durante 6 a 8  horas despus de Warehouse manager sexo y deberetirarse en el plazo de las 24 horas. Un diafragma es recetado y colocado por un mdico. Debe reemplazarse cada 1 a 2 aos, despus de dar a luz, de aumentar ms de 15lb (6.8kg) y de [place]. Capuchn cervical Un capuchn cervical es una copa redonda y blanda de ltex o plstico que se coloca en el cuello uterino. Se inserta en la vagina antes del sexo, junto con un espermicida. Bloquea el ingreso del esperma en el tero. El capuchn Radio producer durante 6 a 8 horas despus de Warehouse manager sexo y debe retirarse en el plazo de las 48 horas. Un capuchn cervical debe ser recetado ycolocado por un mdico. Debe reemplazarse cada 2aos. Esponja Una esponja es una pieza blanda y circular de espuma de poliuretano que contiene espermicida. La esponja ayuda a bloquear el ingreso de esperma en el tero, y el espermicida mata a los espermatozoides. Belva Bertin, debe humedecerla e insertarla en la vagina. Debe insertarse antes de eBay, debe permanecer dentro al menos durante 6 horas despus de Warehouse manager sexo y deberetirarse y Nurse, adult en el plazo de las 30 horas. Espermicidas Los espermicidas son sustancias qumicas que matan o bloquean al esperma y no lo dejan ingresar al cuello uterino y al tero. Vienen en forma de crema, gel, supositorio, espuma o comprimido. Un espermicida debe insertarse en la vagina con un aplicador al menos 10 o 15 minutos antes de tener sexo para dar tiempo a que surta [place]. El proceso debe repetirse cada vez que tenga sexo. Losespermicidas no requieren receta mdica. Anticonceptivos intrauterinos Dispositivo intrauterino (DIU) Un DIU es un dispositivo en forma de T que se coloca en el tero. Existen dos tipos: DIU hormonal.Este tipo contiene progestina, una forma sinttica de la hormona progesterona. Este tipo puede permanecer colocado durante 3 a 5 aos. DIU de cobre.Este tipo est recubierto con un alambre de cobre. Puede  permanecer colocado durante 10 aos. Mtodos anticonceptivos permanentes Ligadura de trompas en la mujer En este mtodo, se sellan, atan u obstruyen las trompas de [place]opio durante [place] para Automotive engineer que el vulo descienda hacia el tero. Esterilizacin histeroscpica En este mtodo, se coloca un implante pequeo y flexible dentro de cada trompa de Falopio. Los implantes hacen que se forme un tejido cicatricial en las trompas de Falopio y que las obstruya para que el espermatozoide no pueda llegar al vulo. El procedimiento demora alrededor de 3 meses para que seaefectivo. Debe utilizarse otro mtodo anticonceptivo durante esos 3 meses. Esterilizacin masculina Este es un procedimiento que consiste en atar los conductos que transportan el esperma (vasectoma). Luego del procedimiento, el hombre Manufacturing engineer lquido (semen). Debe utilizarse otro mtodo anticonceptivo durante 3 meses despus delprocedimiento. Mtodos de planificacin natural Planificacin familiar natural En este mtodo, la pareja no tiene [employer] la mujer podra quedar [place]rawberry. Mtodo calendario En este mtodo, la mujer realiza un seguimiento de la duracin de cada ciclo menstrual, identifica los [employer] que se puede producir un Psychiatrist y no tiene sexo durante esos [address]  [address].  Mtodo de la ovulacin En este mtodo, la pareja evita tener sexo durante la ovulacin. Mtodo sintotrmico Este mtodo implica no tener sexo durante la ovulacin. Normalmente, la mujer comprueba la ovulacinal observar cambios en su temperatura y en la consistencia del moco cervical. Mtodo posovulacin En este mtodo, la pareja espera a que finalice la ovulacin para tener sexo. Dnde buscar ms informacin Centers for Disease Control and Prevention (Centros para el Control y la Prevencin de Enfermedades): www.cdc.gov Resumen La anticoncepcin, o los mtodos anticonceptivos, hace referencia a los mtodos o dispositivos que  evitan el embarazo. Los mtodos anticonceptivos hormonales incluyen implantes, inyecciones, pastillas, parches, anillos vaginales y anticonceptivos de emergencia. Los mtodos anticonceptivos de barrera pueden incluir condones masculinos, condones femeninos, diafragmas, capuchones cervicales, esponjas y espermicidas. Existen dos tipos de DIU (dispositivo intrauterino). Un DIU puede colocarse en el tero de una mujer para evitar el embarazo durante 3 a 5 aos. La esterilizacin permanente puede realizarse mediante un procedimiento tanto en los hombres como en las mujeres. Los mtodos de planificacin familiar natural implican no tener sexo durante los das en que la mujer podra quedar embarazada. Esta informacin no tiene como fin reemplazar el consejo del mdico. Asegresede hacerle al mdico cualquier pregunta que tenga. Document Revised: 06/03/2020 Document Reviewed: 06/03/2020 Elsevier Patient Education  2022 Elsevier Inc.  

## 2021-05-26 NOTE — Progress Notes (Signed)
Pt informed that the ultrasound is considered a limited OB ultrasound and is not intended to be a complete ultrasound exam.  Patient also informed that the ultrasound is not being completed with the intent of assessing for fetal or placental anomalies or any pelvic abnormalities.  Explained that the purpose of today's ultrasound is to assess for  BPP, presentation, and AFI.  Patient acknowledges the purpose of the exam and the limitations of the study.     Chase Caller RN BSN 05/26/21

## 2021-05-26 NOTE — Progress Notes (Signed)
   Subjective:  Carrie Delacruz is a 30 y.o. G2P1 at [redacted]w[redacted]d being seen today for ongoing prenatal care.  She is currently monitored for the following issues for this high-risk pregnancy and has Supervision of high risk pregnancy, antepartum; History of cholestasis during pregnancy; Pruritus of pregnancy in third trimester; Gestational diabetes mellitus (GDM) in third trimester; and Intrahepatic cholestasis of pregnancy, antepartum on their problem list.  Patient reports no complaints.  Contractions: Irritability. Vag. Bleeding: None.  Movement: Present. Denies leaking of fluid.   The following portions of the patient's history were reviewed and updated as appropriate: allergies, current medications, past family history, past medical history, past social history, past surgical history and problem list. Problem list updated.  Objective:   Vitals:   05/26/21 1448  BP: 113/76  Pulse: (!) 104    Fetal Status: Fetal Heart Rate (bpm): NST   Movement: Present     General:  Alert, oriented and cooperative. Patient is in no acute distress.  Skin: Skin is warm and dry. No rash noted.   Cardiovascular: Normal heart rate noted  Respiratory: Normal respiratory effort, no problems with respiration noted  Abdomen: Soft, gravid, appropriate for gestational age. Pain/Pressure: Absent     Pelvic: Vag. Bleeding: None     Cervical exam deferred        Extremities: Normal range of motion.  Edema: Trace  Mental Status: Normal mood and affect. Normal behavior. Normal judgment and thought content.   Urinalysis:      Assessment and Plan:  Pregnancy: G2P1 at [redacted]w[redacted]d  1. Supervision of high risk pregnancy, antepartum BP normal, NST reactive  2. Diet controlled gestational diabetes mellitus (GDM) in third trimester Scheduled to see DM educator today, unfortunately she had to call out Supplies gathered and teaching done on QID fingersticks We will review her log in 2 weeks at her next OB  visit  3. Intrahepatic cholestasis of pregnancy, antepartum Itching improved with ursodiol Getting weekly antenatal testing which has been reassuring  Preterm labor symptoms and general obstetric precautions including but not limited to vaginal bleeding, contractions, leaking of fluid and fetal movement were reviewed in detail with the patient. Please refer to After Visit Summary for other counseling recommendations.  Return in 2 weeks (on 06/09/2021) for [facility], ob visit, needs MD.   Venora Maples, MD

## 2021-05-28 ENCOUNTER — Other Ambulatory Visit: Payer: Self-pay

## 2021-05-29 ENCOUNTER — Encounter: Payer: Self-pay | Admitting: Family Medicine

## 2021-05-30 ENCOUNTER — Ambulatory Visit (HOSPITAL_COMMUNITY)
Admission: EM | Admit: 2021-05-30 | Discharge: 2021-05-30 | Disposition: A | Discharge status: Home / Self Care | Payer: Self-pay | Attending: Physician Assistant | Admitting: Physician Assistant

## 2021-05-30 ENCOUNTER — Other Ambulatory Visit: Payer: Self-pay

## 2021-05-30 ENCOUNTER — Encounter (HOSPITAL_COMMUNITY): Payer: Self-pay

## 2021-05-30 DIAGNOSIS — J019 Acute sinusitis, unspecified: Secondary | ICD-10-CM

## 2021-05-30 MED ORDER — AMOXICILLIN 875 MG PO TABS: 875.0000 mg | ORAL_TABLET | Freq: Two times a day (BID) | ORAL | 0 refills | Status: AC

## 2021-05-30 NOTE — ED Provider Notes (Signed)
[facility]    CSN: [id number] Arrival date & time: 05/30/21  1354      History   Chief Complaint Chief Complaint  Patient presents with   Dental Pain    HPI Carrie Delacruz is a 30 y.o. female.   Pt complains of right sided facial pain, right ear pain, right upper dental pain that started about one week ago.  She reports she began experiencing congestion and cough about one week ago with minimal relief, now experiencing facial pain.  Denies fevers at home, n/v/d, shortness of breath.  She is currently [redacted] weeks pregnant followed by OB, per notes high risk pregnancy.  She has taken tylenol with temporary relief.     Past Medical History:  Diagnosis Date   Cholestasis    Metrorrhagia     Patient Active Problem List   Diagnosis Date Noted   Intrahepatic cholestasis of pregnancy, antepartum 05/17/2021   Gestational diabetes mellitus (GDM) in third trimester 05/16/2021   Pruritus of pregnancy in third trimester 05/15/2021   Supervision of high risk pregnancy, antepartum 01/28/2021   History of cholestasis during pregnancy 01/28/2021    Past Surgical History:  Procedure Laterality Date   NO PAST SURGERIES      OB History     Gravida  2   Para  1   Term      Preterm      AB      Living  1      SAB      IAB      Ectopic      Multiple      Live Births  1            Home Medications    Prior to Admission medications   Medication Sig Start Date End Date Taking? Authorizing Provider  ursodiol (ACTIGALL) 500 MG tablet Take 1 tablet (500 mg total) by mouth 2 (two) times daily. 05/17/21  Yes Anyanwu, Jethro Bastos, MD  acetaminophen (TYLENOL) 500 MG tablet Take 500 mg by mouth every 6 (six) hours as needed.    [provider]  hydrOXYzine (ATARAX/VISTARIL) 25 MG tablet Take 1-2 tablets (25-50 mg total) by mouth every 6 (six) hours as needed for itching. 05/15/21   Anyanwu, Jethro Bastos, MD  loratadine (CLARITIN) 10 MG tablet  Take 1 tablet (10 mg total) by mouth daily. 05/26/21   Venora Maples, MD  Prenatal Vit-Fe Fumarate-FA (PRENATAL MULTIVITAMIN) TABS tablet Take 1 tablet by mouth daily at 12 noon.    [provider]    Family History Family History  Problem Relation Age of Onset   Diabetes Mother    Asthma Mother     Social History Social History   Tobacco Use   Smoking status: Never   Smokeless tobacco: Never  Vaping Use   Vaping Use: Never used  Substance Use Topics   Alcohol use: Never   Drug use: Never     Allergies   Patient has no known allergies.   Review of Systems Review of Systems  Constitutional:  Negative for chills and fever.  HENT:  Positive for congestion, ear pain, sinus pressure and sinus pain. Negative for sore throat.   Eyes:  Negative for pain and visual disturbance.  Respiratory:  Positive for cough. Negative for shortness of breath.   Cardiovascular:  Negative for chest pain and palpitations.  Gastrointestinal:  Negative for abdominal pain and vomiting.  Genitourinary:  Negative for dysuria and hematuria.  Musculoskeletal:  Negative for arthralgias and back pain.  Skin:  Negative for color change and rash.  Neurological:  Negative for seizures and syncope.  All other systems reviewed and are negative.   Physical Exam Triage Vital Signs ED Triage Vitals  Enc Vitals Group     BP 05/30/21 1604 100/69     Pulse Rate 05/30/21 1604 100     Resp 05/30/21 1604 18     Temp 05/30/21 1604 98 F (36.7 C)     Temp Source 05/30/21 1604 Oral     SpO2 05/30/21 1604 95 %     Weight --      Height --      Head Circumference --      Peak Flow --      Pain Score 05/30/21 1600 8     Pain Loc --      Pain Edu? --      Excl. in GC? --    No data found.  Updated Vital Signs BP 100/69 (BP Location: Left Arm)   Pulse 100   Temp 98 F (36.7 C) (Oral)   Resp 18   LMP 11/18/2020 (Approximate)   SpO2 95%   Visual Acuity Right Eye Distance:   Left Eye  Distance:   Bilateral Distance:    Right Eye Near:   Left Eye Near:    Bilateral Near:     Physical Exam Vitals and nursing note reviewed.  Constitutional:      General: She is not in acute distress.    Appearance: She is well-developed.  HENT:     Head: Normocephalic and atraumatic.     Right Ear: Hearing and tympanic membrane normal.     Left Ear: Hearing and tympanic membrane normal.     Nose:     Right Sinus: Maxillary sinus tenderness present.     Left Sinus: Maxillary sinus tenderness present.  Eyes:     Conjunctiva/sclera: Conjunctivae normal.  Cardiovascular:     Rate and Rhythm: Normal rate and regular rhythm.     Heart sounds: No murmur heard. Pulmonary:     Effort: Pulmonary effort is normal. No respiratory distress.     Breath sounds: Normal breath sounds.  Abdominal:     Palpations: Abdomen is soft.     Tenderness: There is no abdominal tenderness.  Musculoskeletal:     Cervical back: Neck supple.  Skin:    General: Skin is warm and dry.  Neurological:     Mental Status: She is alert.     UC Treatments / Results  Labs (all labs ordered are listed, but only abnormal results are displayed) Labs Reviewed - No data to display  EKG   Radiology No results found.  Procedures Procedures (including critical care time)  Medications Ordered in UC Medications - No data to display  Initial Impression / Assessment and Plan / UC Course  I have reviewed the triage vital signs and the nursing notes.  Pertinent labs & imaging results that were available during my care of the patient were reviewed by me and considered in my medical decision making (see chart for details).     Acute sinusitis, amoxicillin prescribed.  List of cold/allergy medications that are safe during pregnancy given. Return precautions discussed.  Final Clinical Impressions(s) / UC Diagnoses   Final diagnoses:  None   Discharge Instructions   None    ED Prescriptions   None     PDMP not reviewed this encounter.  Jodell Cipro, PA-C 05/30/21 1635

## 2021-05-30 NOTE — ED Triage Notes (Addendum)
Pt c/o ear pain, and tooth pain and facial pain to right. Denies hitting head., and getting anything in eye/ ear. Pt states she had a cold last week that she is still symptomatic from. FYI: patient is [redacted]Wks Pregnant  Started: 2-3 days  Interventions: tylenol,  helps somewhat.

## 2021-05-30 NOTE — Discharge Instructions (Addendum)
Take medication as prescribed.

## 2021-06-01 ENCOUNTER — Other Ambulatory Visit: Payer: Self-pay

## 2021-06-01 DIAGNOSIS — O24410 Gestational diabetes mellitus in pregnancy, diet controlled: Secondary | ICD-10-CM

## 2021-06-03 ENCOUNTER — Other Ambulatory Visit: Payer: Self-pay

## 2021-06-04 ENCOUNTER — Ambulatory Visit (INDEPENDENT_AMBULATORY_CARE_PROVIDER_SITE_OTHER): Payer: Self-pay

## 2021-06-04 ENCOUNTER — Ambulatory Visit: Payer: Self-pay | Admitting: Registered"

## 2021-06-04 ENCOUNTER — Encounter: Payer: Self-pay | Attending: Family Medicine | Admitting: Registered"

## 2021-06-04 ENCOUNTER — Ambulatory Visit: Payer: Self-pay | Admitting: *Deleted

## 2021-06-04 VITALS — BP 106/74 | HR 80

## 2021-06-04 DIAGNOSIS — O24419 Gestational diabetes mellitus in pregnancy, unspecified control: Secondary | ICD-10-CM

## 2021-06-04 DIAGNOSIS — O26613 Liver and biliary tract disorders in pregnancy, third trimester: Secondary | ICD-10-CM

## 2021-06-04 DIAGNOSIS — O24410 Gestational diabetes mellitus in pregnancy, diet controlled: Secondary | ICD-10-CM | POA: Insufficient documentation

## 2021-06-04 DIAGNOSIS — K831 Obstruction of bile duct: Secondary | ICD-10-CM

## 2021-06-04 NOTE — Progress Notes (Signed)
Interpreter Eunice Blase [phone] AMN Video  Patient was seen for Gestational Diabetes self-management on 06/04/21  Start time 1350 and End time 1320   Estimated due date: 08/01/21; [redacted]w[redacted]d  Clinical: Medications: prenatal, antihistamines Medical History: reviewed Labs: OGTT elevated 1 & 2 hr, no A1c    Dietary and Lifestyle History: Patient states she has changed her diet since starting to check her blood sugar. Patient reports she eats vegetables a few times per week. 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 Supper PPBG mostly elevated due to 4-5 tortillas.  Physical Activity: ADLs Stress: not assessed Sleep:not assessed  24 hr Recall: First Meal: ham sandwich Snack: fruit Second meal: chicken, beans Snack: Third meal: 4-5 tortilla, meat Snack: Beverages: mostly water ~4 bottles, sweetened bev every other day  NUTRITION INTERVENTION  Nutrition education (E-1) on the following topics:   Initial Follow-up  []  []  Definition of Gestational Diabetes [x]  []  Why dietary management is important in controlling blood glucose [x]  []  Effects each nutrient has on blood glucose levels [x]  []  Simple carbohydrates vs complex carbohydrates []  []  Fluid intake []  []  Creating a balanced meal plan []  []  Carbohydrate counting  [x]  []  When to check blood glucose levels [x]  []  Proper blood glucose monitoring techniques [x]  []  Effect of stress and stress reduction techniques  [x]  []  Exercise effect on blood glucose levels, appropriate exercise during pregnancy []  []  Importance of limiting caffeine and abstaining from alcohol and smoking [x]  []  Medications used for blood sugar control during pregnancy []  []  Hypoglycemia and rule of 15 [x]  []  Postpartum self care  Blood glucose monitor given: [employer]dig[employer] (Patient was given meter on 7/12.)  Lot # CBG: 168 mg/dL  Patient forgets to check at work, but states her meals are  pretty similar to the ones that she has tested. FBS: WNL Postprandial: B&L WNL; supper mostly elevated   Patient instructed to monitor glucose levels: FBS: 60 - ? 95 mg/dL (some clinics use 90 for cutoff) 1 hour: ? 140 mg/dL 2 hour: ? mg/dL  Patient received handouts: Nutrition Diabetes and Pregnancy Carbohydrate Counting List  Patient will be seen for follow-up as needed.

## 2021-06-04 NOTE — Progress Notes (Signed)

## 2021-06-08 ENCOUNTER — Ambulatory Visit (INDEPENDENT_AMBULATORY_CARE_PROVIDER_SITE_OTHER): Payer: Self-pay | Admitting: Obstetrics & Gynecology

## 2021-06-08 ENCOUNTER — Ambulatory Visit (INDEPENDENT_AMBULATORY_CARE_PROVIDER_SITE_OTHER): Payer: Self-pay

## 2021-06-08 ENCOUNTER — Ambulatory Visit: Payer: Self-pay | Admitting: *Deleted

## 2021-06-08 ENCOUNTER — Other Ambulatory Visit: Payer: Self-pay

## 2021-06-08 VITALS — BP 112/72 | HR 69 | Wt 144.8 lb

## 2021-06-08 DIAGNOSIS — O26613 Liver and biliary tract disorders in pregnancy, third trimester: Secondary | ICD-10-CM

## 2021-06-08 DIAGNOSIS — O0990 Supervision of high risk pregnancy, unspecified, unspecified trimester: Secondary | ICD-10-CM

## 2021-06-08 DIAGNOSIS — K831 Obstruction of bile duct: Secondary | ICD-10-CM

## 2021-06-08 DIAGNOSIS — O24410 Gestational diabetes mellitus in pregnancy, diet controlled: Secondary | ICD-10-CM

## 2021-06-08 DIAGNOSIS — Z3A32 32 weeks gestation of pregnancy: Secondary | ICD-10-CM

## 2021-06-08 DIAGNOSIS — O26619 Liver and biliary tract disorders in pregnancy, unspecified trimester: Secondary | ICD-10-CM

## 2021-06-08 NOTE — Progress Notes (Signed)
   PRENATAL VISIT NOTE  Subjective:  Carrie Delacruz is a 30 y.o. G2P1 at [redacted]w[redacted]d being seen today for ongoing prenatal care.  She is currently monitored for the following issues for this high-risk pregnancy and has Supervision of high risk pregnancy, antepartum; History of cholestasis during pregnancy; Pruritus of pregnancy in third trimester; Gestational diabetes mellitus (GDM) in third trimester; and Intrahepatic cholestasis of pregnancy, antepartum on their problem list.  Patient reports no complaints.  Contractions: Irregular. Vag. Bleeding: None.  Movement: Present. Denies leaking of fluid.   The following portions of the patient's history were reviewed and updated as appropriate: allergies, current medications, past family history, past medical history, past social history, past surgical history and problem list.   Objective:   Vitals:   06/08/21 1137  BP: 112/72  Pulse: 69  Weight: 144 lb 12.8 oz (65.7 kg)    Fetal Status: Fetal Heart Rate (bpm): NST   Movement: Present     General:  Alert, oriented and cooperative. Patient is in no acute distress.  Skin: Skin is warm and dry. No rash noted.   Cardiovascular: Normal heart rate noted  Respiratory: Normal respiratory effort, no problems with respiration noted  Abdomen: Soft, gravid, appropriate for gestational age.  Pain/Pressure: Absent     Pelvic: Cervical exam deferred        Extremities: Normal range of motion.     Mental Status: Normal mood and affect. Normal behavior. Normal judgment and thought content.   Assessment and Plan:  Pregnancy: G2P1 at [redacted]w[redacted]d 1. Supervision of high risk pregnancy, antepartum Reassuring fetal monitoring BPP 10/10  2. Cholestasis of pregnancy, third trimester On Actigall and hydroxyzine  3. Diet controlled gestational diabetes mellitus (GDM) in third trimester FBS normal but >140 at dinner, consider adding metformin 500 mg at breakfast  4. Intrahepatic cholestasis of  pregnancy, antepartum   Preterm labor symptoms and general obstetric precautions including but not limited to vaginal bleeding, contractions, leaking of fluid and fetal movement were reviewed in detail with the patient. Please refer to After Visit Summary for other counseling recommendations.   Return in about 9 days (around 06/17/2021) for as scheduled.  Future Appointments  Date Time Provider Department Center  06/08/2021  2:15 PM Adam Phenix, MD [facility]  06/17/2021  8:15 AM Venora Maples, MD [facility]  06/17/2021  9:15 AM [facility]  06/17/2021 10:15 AM [facility]  06/25/2021 11:15 AM [facility]  06/25/2021  2:15 PM Adam Phenix, MD [facility]  06/25/2021  3:15 PM [facility]  07/02/2021  9:15 AM Warden Fillers, MD [facility]    Scheryl Darter, MD

## 2021-06-09 ENCOUNTER — Encounter: Payer: Self-pay | Admitting: Family Medicine

## 2021-06-10 ENCOUNTER — Other Ambulatory Visit: Payer: Self-pay

## 2021-06-10 ENCOUNTER — Encounter: Payer: Self-pay | Admitting: Family Medicine

## 2021-06-17 ENCOUNTER — Inpatient Hospital Stay (HOSPITAL_COMMUNITY)
Admission: AD | Admit: 2021-06-17 | Discharge: 2021-06-17 | Disposition: A | Discharge status: Home / Self Care | Payer: Self-pay | Attending: Obstetrics & Gynecology | Admitting: Obstetrics & Gynecology

## 2021-06-17 ENCOUNTER — Other Ambulatory Visit: Payer: Self-pay

## 2021-06-17 ENCOUNTER — Ambulatory Visit (INDEPENDENT_AMBULATORY_CARE_PROVIDER_SITE_OTHER): Payer: Self-pay | Admitting: Family Medicine

## 2021-06-17 ENCOUNTER — Encounter (HOSPITAL_COMMUNITY): Payer: Self-pay | Admitting: Obstetrics & Gynecology

## 2021-06-17 ENCOUNTER — Ambulatory Visit (INDEPENDENT_AMBULATORY_CARE_PROVIDER_SITE_OTHER): Payer: Self-pay

## 2021-06-17 ENCOUNTER — Ambulatory Visit: Payer: Self-pay | Admitting: *Deleted

## 2021-06-17 ENCOUNTER — Encounter: Payer: Self-pay | Admitting: Family Medicine

## 2021-06-17 VITALS — BP 102/68 | HR 78 | Wt 145.2 lb

## 2021-06-17 DIAGNOSIS — O0990 Supervision of high risk pregnancy, unspecified, unspecified trimester: Secondary | ICD-10-CM

## 2021-06-17 DIAGNOSIS — K831 Obstruction of bile duct: Secondary | ICD-10-CM | POA: Insufficient documentation

## 2021-06-17 DIAGNOSIS — O24415 Gestational diabetes mellitus in pregnancy, controlled by oral hypoglycemic drugs: Secondary | ICD-10-CM

## 2021-06-17 DIAGNOSIS — O4703 False labor before 37 completed weeks of gestation, third trimester: Secondary | ICD-10-CM | POA: Insufficient documentation

## 2021-06-17 DIAGNOSIS — O26613 Liver and biliary tract disorders in pregnancy, third trimester: Secondary | ICD-10-CM | POA: Insufficient documentation

## 2021-06-17 DIAGNOSIS — Z3A33 33 weeks gestation of pregnancy: Secondary | ICD-10-CM | POA: Insufficient documentation

## 2021-06-17 DIAGNOSIS — Z7984 Long term (current) use of oral hypoglycemic drugs: Secondary | ICD-10-CM | POA: Insufficient documentation

## 2021-06-17 DIAGNOSIS — O26619 Liver and biliary tract disorders in pregnancy, unspecified trimester: Secondary | ICD-10-CM

## 2021-06-17 DIAGNOSIS — Z79899 Other long term (current) drug therapy: Secondary | ICD-10-CM | POA: Insufficient documentation

## 2021-06-17 DIAGNOSIS — O4700 False labor before 37 completed weeks of gestation, unspecified trimester: Secondary | ICD-10-CM

## 2021-06-17 LAB — POCT URINALYSIS DIP (DEVICE)
Bilirubin Urine: NEGATIVE
Glucose, UA: NEGATIVE mg/dL
Hgb urine dipstick: NEGATIVE
Ketones, ur: NEGATIVE mg/dL
Nitrite: NEGATIVE
Protein, ur: NEGATIVE mg/dL
Specific Gravity, Urine: 1.02 (ref 1.005–1.030)
Urobilinogen, UA: 0.2 mg/dL (ref 0.0–1.0)
pH: 5.5 (ref 5.0–8.0)

## 2021-06-17 MED ORDER — LACTATED RINGERS IV BOLUS
1000.0000 mL | Freq: Once | INTRAVENOUS | Status: AC
  Administered 2021-06-17: 1000 mL via INTRAVENOUS

## 2021-06-17 MED ORDER — NIFEDIPINE 10 MG PO CAPS
10.0000 mg | ORAL_CAPSULE | ORAL | Status: AC | PRN
Start: 2021-06-17 — End: 2021-06-17
  Administered 2021-06-17 (×3): 10 mg via ORAL
  Filled 2021-06-17 (×3): qty 1

## 2021-06-17 MED ORDER — METFORMIN HCL 500 MG PO TABS
500.0000 mg | ORAL_TABLET | Freq: Two times a day (BID) | ORAL | 1 refills | Status: DC
  Filled 2021-06-17: qty 60, 30d supply, fill #0

## 2021-06-17 NOTE — Progress Notes (Addendum)
   Subjective:  Carrie Delacruz is a 30 y.o. G2P1 at [redacted]w[redacted]d being seen today for ongoing prenatal care.  She is currently monitored for the following issues for this high-risk pregnancy and has Supervision of high risk pregnancy, antepartum; History of cholestasis during pregnancy; Pruritus of pregnancy in third trimester; Gestational diabetes mellitus (GDM) in third trimester; and Intrahepatic cholestasis of pregnancy, antepartum on their problem list.  Patient reports no complaints.  Contractions: Irritability. Vag. Bleeding: None.  Movement: Present. Denies leaking of fluid.   The following portions of the patient's history were reviewed and updated as appropriate: allergies, current medications, past family history, past medical history, past social history, past surgical history and problem list. Problem list updated.  Objective:   Vitals:   06/17/21 0827  BP: 102/68  Pulse: 78  Weight: 145 lb 3.2 oz (65.9 kg)    Fetal Status: Fetal Heart Rate (bpm): 158   Movement: Present  Presentation: Vertex  General:  Alert, oriented and cooperative. Patient is in no acute distress.  Skin: Skin is warm and dry. No rash noted.   Cardiovascular: Normal heart rate noted  Respiratory: Normal respiratory effort, no problems with respiration noted  Abdomen: Soft, gravid, appropriate for gestational age. Pain/Pressure: Absent     Pelvic: Vag. Bleeding: None     Cervical exam performed Dilation: 3 Effacement (%): 40 Station: -1  Extremities: Normal range of motion.  Edema: None  Mental Status: Normal mood and affect. Normal behavior. Normal judgment and thought content.   Urinalysis:      Assessment and Plan:  Pregnancy: G2P1 at [redacted]w[redacted]d  1. Supervision of high risk pregnancy, antepartum BP and FHR normal  2. Gestational diabetes mellitus (GDM) in third trimester controlled on oral hypoglycemic drug Many missing values on glucose log and of those present only 60% are at goal Start  metformin 500mg  BID, also seeing later today to discuss diet Has weekly BPP scheduled for later today Last growth and AFI normal on 04/29/21 Schedule repeat growth 05/01/21 due to starting medication  3. Intrahepatic cholestasis of pregnancy, antepartum On ursodiol, reports symptoms are not a problem at present Scheduled for IOL at 37 weeks today Weekly BPP has been reassuring to date  Preterm labor symptoms and general obstetric precautions including but not limited to vaginal bleeding, contractions, leaking of fluid and fetal movement were reviewed in detail with the patient. Please refer to After Visit Summary for other counseling recommendations.  Return in 2 weeks (on 07/01/2021) for [facility], ob visit.   [facility], MD   Addendum: BPP 10/10, however noted to be contracting q2-4 min. On re-evaluation patient said they are not particularly painful though she does notice them. Cervix 3/40/-1, somewhat more dilation than I would expect and vertex is clearly low. Overall does not appear particularly laborous but will send to MAU regardless for observation and serial cervical exams. Borderline for betamethasone given [redacted]w[redacted]d and GDM with initiation of medications today, defer that decision to MAU provider/second attending. Signout given to MAU provider.   11:03 AM

## 2021-06-17 NOTE — MAU Note (Signed)
Pt sent from clinic for ctx they where tracing on monitor. Felt stronger earlier but not really feeling them now. Good fetal movement reported.

## 2021-06-17 NOTE — Progress Notes (Addendum)
Scheduled growth U/S on 06/26/21 @ 1315.  IOL scheduled for 07/11/21 in the morning.  Pt notified.  Addison Naegeli, RN  06/17/21

## 2021-06-17 NOTE — MAU Provider Note (Signed)
History    [id number]  Arrival date and time: 06/17/21 1232  Chief Complaint  Patient presents with   Contractions    HPI Carrie Delacruz is a 30 y.o. at [redacted]w[redacted]d by [place] who presents from clinic to MAU due to contractions. Her pregnancy has been complicated by cholestasis and GDM. She was seen for a routine prenatal visit this morning and was started on Metformin. Her symptoms relating to cholestasis have been controlled. She had a BPP performed after her visit which was a 10/10 however she was noted to be contracting every 2-4 minutes. Patient reported that they were not particularly painful but that she did feel them. Cervical exam performed ~11:03 and patient found to be 3/40/-1. Due to concern for preterm labor, patient was sent to MAU for further evaluation and serial cervical checks. Upon arrival, patient states that she continues to feel her contractions and rates them as a 5/10 in severity. They feel similar to how they did when she was in clinic.  Vaginal bleeding: No - only pink discharge when wiping after using bathroom in MAU LOF: No Fetal Movement: Yes Contractions: Yes  O/Positive/-- (03/16 0946)  OB History     Gravida  2   Para  1   Term      Preterm  1   AB      Living  1      SAB      IAB      Ectopic      Multiple      Live Births  1           Past Medical History:  Diagnosis Date   Cholestasis    Metrorrhagia     Past Surgical History:  Procedure Laterality Date   NO PAST SURGERIES      Family History  Problem Relation Age of Onset   Diabetes Mother    Asthma Mother     Social History   Socioeconomic History   Marital status: Single    Spouse name: Not on file   Number of children: Not on file   Years of education: Not on file   Highest education level: Not on file  Occupational History   Not on file  Tobacco Use   Smoking status: Never   Smokeless tobacco: Never  Vaping Use   Vaping Use: Never used   Substance and Sexual Activity   Alcohol use: Never   Drug use: Never   Sexual activity: Yes    Birth control/protection: None    Comment: Depo Provera  Other Topics Concern   Not on file  Social History Narrative   Not on file   Social Determinants of Health   Financial Resource Strain: Not on file  Food Insecurity: Food Insecurity Present   Worried About Running Out of Food in the Last Year: Sometimes true   Ran Out of Food in the Last Year: Sometimes true  Transportation Needs: No Transportation Needs   Lack of Transportation (Medical): No   Lack of Transportation (Non-Medical): No  Physical Activity: Not on file  Stress: Not on file  Social Connections: Not on file  Intimate Partner Violence: Not on file    No Known Allergies  No current facility-administered medications on file prior to encounter.   Current Outpatient Medications on File Prior to Encounter  Medication Sig Dispense Refill   acetaminophen (TYLENOL) 500 MG tablet Take 500 mg by mouth every 6 (six) hours as needed.  hydrOXYzine (ATARAX/VISTARIL) 25 MG tablet Take 1-2 tablets (25-50 mg total) by mouth every 6 (six) hours as needed for itching. 60 tablet 2   loratadine (CLARITIN) 10 MG tablet Take 1 tablet (10 mg total) by mouth daily. (Patient not taking: Reported on 06/17/2021) 30 tablet 2   metFORMIN (GLUCOPHAGE) 500 MG tablet Take 1 tablet (500 mg total) by mouth 2 (two) times daily with a meal. 180 tablet 1   Prenatal Vit-Fe Fumarate-FA (PRENATAL MULTIVITAMIN) TABS tablet Take 1 tablet by mouth daily at 12 noon.     ursodiol (ACTIGALL) 500 MG tablet Take 1 tablet (500 mg total) by mouth 2 (two) times daily. 60 tablet 3    ROS Pertinent positives and negative per HPI, all others reviewed and negative  Physical Exam   BP 115/67 (BP Location: Left Arm)   Pulse 82   Temp 98.1 F (36.7 C) (Oral)   Resp 16   Wt 66.2 kg   LMP 11/18/2020 (Approximate)   SpO2 98%   BMI 30.23 kg/m   Physical  Exam Exam conducted with a chaperone present.  Constitutional:      General: She is not in acute distress.    Appearance: Normal appearance.  HENT:     Head: Normocephalic and atraumatic.     Mouth/Throat:     Mouth: Mucous membranes are moist.  Eyes:     Conjunctiva/sclera: Conjunctivae normal.  Cardiovascular:     Rate and Rhythm: Normal rate and regular rhythm.     Heart sounds: No murmur heard. Pulmonary:     Effort: Pulmonary effort is normal.     Breath sounds: Normal breath sounds.  Abdominal:     General: Bowel sounds are normal.     Palpations: Abdomen is soft.     Tenderness: There is no abdominal tenderness. There is no guarding or rebound.  Genitourinary:    General: Normal vulva.     Comments: Cervix 2/50/-3 Musculoskeletal:     Right lower leg: No edema.     Left lower leg: No edema.  Skin:    General: Skin is warm and dry.  Neurological:     Mental Status: She is alert and oriented to person, place, and time.  Psychiatric:        Mood and Affect: Mood normal.        Behavior: Behavior normal.   Cervical Exam Dilation: 2 Effacement (%): 50 Cervical Position: Posterior Station: -3 Presentation: Vertex Exam by:: Dr Orion Modest Baseline 150 bpm, moderate variability, + accels, - decels Toco: Contractions every 2-4 minutes Cat: 1  Labs None  Imaging None  MAU Course  Procedures Lab Orders  No laboratory test(s) ordered today   Meds ordered this encounter  Medications   NIFEdipine (PROCARDIA) capsule 10 mg   lactated ringers bolus 1,000 mL   Imaging Orders  No imaging studies ordered today    MDM Patient here from clinic with preterm contractions. SVE in office 3/40/-1.   Tocometer with contractions every 2-4 minutes.   Intial SVE in MAU 2/50/-3.   Procardia 10 mg every 20 minutes ordered. S/p 3 doeses.  1L of LR given.  Repeat exam 2 hours later unchanged.   Patient is not in preterm labor at this time. Category 1 tracing  while in MAU.   Assessment and Plan   Preterm contractions -Stable for discharge home  -Preterm labor precautions reviewed -Encouraged patient to continue medications as prescribed for GDM and cholestasis  -Appropriate follow up scheduled -  Patient voiced understanding and agreeable to plan   Evalina Field, MD OB Fellow, Faculty Practice [facility], Center for [facility] 06/17/2021 4:51 PM

## 2021-06-17 NOTE — Progress Notes (Signed)

## 2021-06-17 NOTE — Patient Instructions (Signed)
Eleccin del mtodo anticonceptivo Contraception Choices La anticoncepcin, o los mtodos anticonceptivos, hace referencia a los mtodos o dispositivos que evitan el embarazo. Mtodos hormonales Implante anticonceptivo Un implante anticonceptivo consiste en un tubo delgado de plstico que contiene una hormona que evita el embarazo. Es diferente de un dispositivo intrauterino (DIU). Un mdico lo inserta en la parte superior del brazo. Los implantes pueden ser eficaces durante un mximo de 3 aos. Inyecciones de progestina sola Las inyecciones de progestina sola contienen progestina, una forma sinttica de la hormona progesterona. Un mdico las administra cada 3 meses. Pldoras anticonceptivas Las pldoras anticonceptivas son pastillas que contienen hormonas que evitan el embarazo. Deben tomarse una vez al da, preferentemente a la misma hora cada da. Se necesita una receta para utilizar este mtodo anticonceptivo. Parche anticonceptivo El parche anticonceptivo contiene hormonas que evitan el embarazo. Se coloca en la piel, debe cambiarse una vez a la semana durante tres semanas y debe retirarse en la cuarta semana. Se necesita una receta para utilizar este mtodo anticonceptivo. Anillo vaginal Un anillo vaginal contiene hormonas que evitan el embarazo. Se coloca en la vagina durante tres semanas y se retira en la cuarta semana. Luego se repite el proceso con un anillo nuevo. Se necesita una receta para utilizar este mtodo anticonceptivo. Anticonceptivo de emergencia Los anticonceptivos de emergencia son mtodos para evitar un embarazo despus de tener sexo sin proteccin. Vienen en forma de pldora y pueden tomarse hasta 5 das despus de tener sexo. Funcionan mejor cuando se toman lo ms pronto posible luego de tener sexo. La mayora de los anticonceptivos de emergencia estn disponibles sin receta mdica. Este mtodo no debe utilizarse como el nico mtodo anticonceptivo. Mtodos de  barrera Condn masculino Un condn masculino es una vaina delgada que se coloca sobre el pene durante el sexo. Los condones evitan que el esperma ingrese en el cuerpo de la mujer. Pueden utilizarse con un una sustancia que mata a los espermatozoides (espermicida) para aumentar la efectividad. Deben desecharse despus de un uso. Condn femenino Un condn femenino es una vaina blanda y holgada que se coloca en la vagina antes de tener sexo. El condn evita que el esperma ingrese en el cuerpo de la mujer. Deben desecharse despus de un uso. Diafragma Un diafragma es una barrera blanda con forma de cpula. Se inserta en la vagina antes del sexo, junto con un espermicida. El diafragma bloquea el ingreso de esperma en el tero, y el espermicida mata a los espermatozoides. El diafragma debe permanecer en la vagina durante 6 a 8 horas despus de tener sexo y debe retirarse en el plazo de las 24 horas. Un diafragma es recetado y colocado por un mdico. Debe reemplazarse cada 1 a 2 aos, despus de dar a luz, de aumentar ms de 15lb (6.8kg) y de una ciruga plvica. Capuchn cervical Un capuchn cervical es una copa redonda y blanda de ltex o plstico que se coloca en el cuello uterino. Se inserta en la vagina antes del sexo, junto con un espermicida. Bloquea el ingreso del esperma en el tero. El capuchn debe permanecer en el lugar durante 6 a 8 horas despus de tener sexo y debe retirarse en el plazo de las 48 horas. Un capuchn cervical debe ser recetado y colocado por un mdico. Debe reemplazarse cada 2aos. Esponja Una esponja es una pieza blanda y circular de espuma de poliuretano que contiene espermicida. La esponja ayuda a bloquear el ingreso de esperma en el tero, y el espermicida mata a   los espermatozoides. Para utilizarla, debe humedecerla e insertarla en la vagina. Debe insertarse antes de tener sexo, debe permanecer dentro al menos durante 6 horas despus de tener sexo y debe retirarse y  desecharse en el plazo de las 30 horas. Espermicidas Los espermicidas son sustancias qumicas que matan o bloquean al esperma y no lo dejan ingresar al cuello uterino y al tero. Vienen en forma de crema, gel, supositorio, espuma o comprimido. Un espermicida debe insertarse en la vagina con un aplicador al menos 10 o 15 minutos antes de tener sexo para dar tiempo a que surta efecto. El proceso debe repetirse cada vez que tenga sexo. Los espermicidas no requieren receta mdica. Anticonceptivos intrauterinos Dispositivo intrauterino (DIU) Un DIU es un dispositivo en forma de T que se coloca en el tero. Existen dos tipos: DIU hormonal.Este tipo contiene progestina, una forma sinttica de la hormona progesterona. Este tipo puede permanecer colocado durante 3 a 5 aos. DIU de cobre.Este tipo est recubierto con un alambre de cobre. Puede permanecer colocado durante 10 aos. Mtodos anticonceptivos permanentes Ligadura de trompas en la mujer En este mtodo, se sellan, atan u obstruyen las trompas de Falopio durante una ciruga para evitar que el vulo descienda hacia el tero. Esterilizacin histeroscpica En este mtodo, se coloca un implante pequeo y flexible dentro de cada trompa de Falopio. Los implantes hacen que se forme un tejido cicatricial en las trompas de Falopio y que las obstruya para que el espermatozoide no pueda llegar al vulo. El procedimiento demora alrededor de 3 meses para que sea efectivo. Debe utilizarse otro mtodo anticonceptivo durante esos 3 meses. Esterilizacin masculina Este es un procedimiento que consiste en atar los conductos que transportan el esperma (vasectoma). Luego del procedimiento, el hombre puede eyacular lquido (semen). Debe utilizarse otro mtodo anticonceptivo durante 3 meses despus del procedimiento. Mtodos de planificacin natural Planificacin familiar natural En este mtodo, la pareja no tiene sexo durante los das en que la mujer podra quedar  embarazada. Mtodo calendario En este mtodo, la mujer realiza un seguimiento de la duracin de cada ciclo menstrual, identifica los das en los que se puede producir un embarazo y no tiene sexo durante esos das. Mtodo de la ovulacin En este mtodo, la pareja evita tener sexo durante la ovulacin. Mtodo sintotrmico Este mtodo implica no tener sexo durante la ovulacin. Normalmente, la mujer comprueba la ovulacin al observar cambios en su temperatura y en la consistencia del moco cervical. Mtodo posovulacin En este mtodo, la pareja espera a que finalice la ovulacin para tener sexo. Dnde buscar ms informacin Centers for Disease Control and Prevention (Centros para el Control y la Prevencin de Enfermedades): www.cdc.gov Resumen La anticoncepcin, o los mtodos anticonceptivos, hace referencia a los mtodos o dispositivos que evitan el embarazo. Los mtodos anticonceptivos hormonales incluyen implantes, inyecciones, pastillas, parches, anillos vaginales y anticonceptivos de emergencia. Los mtodos anticonceptivos de barrera pueden incluir condones masculinos, condones femeninos, diafragmas, capuchones cervicales, esponjas y espermicidas. Existen dos tipos de DIU (dispositivo intrauterino). Un DIU puede colocarse en el tero de una mujer para evitar el embarazo durante 3 a 5 aos. La esterilizacin permanente puede realizarse mediante un procedimiento tanto en los hombres como en las mujeres. Los mtodos de planificacin familiar natural implican no tener sexo durante los das en que la mujer podra quedar embarazada. Esta informacin no tiene como fin reemplazar el consejo del mdico. Asegrese de hacerle al mdico cualquier pregunta que tenga. Document Revised: 06/03/2020 Document Reviewed: 06/03/2020 Elsevier Patient Education  2022 Elsevier   Inc.  

## 2021-06-18 ENCOUNTER — Other Ambulatory Visit: Payer: Self-pay

## 2021-06-19 ENCOUNTER — Other Ambulatory Visit: Payer: Self-pay

## 2021-06-22 ENCOUNTER — Inpatient Hospital Stay (HOSPITAL_COMMUNITY)
Admission: AD | Admit: 2021-06-22 | Discharge: 2021-06-25 | DRG: 805 | Disposition: A | Discharge status: Home / Self Care | Payer: Medicaid Other | Attending: Obstetrics & Gynecology | Admitting: Obstetrics & Gynecology

## 2021-06-22 ENCOUNTER — Encounter (HOSPITAL_COMMUNITY): Payer: Self-pay | Admitting: Obstetrics and Gynecology

## 2021-06-22 DIAGNOSIS — O24425 Gestational diabetes mellitus in childbirth, controlled by oral hypoglycemic drugs: Secondary | ICD-10-CM | POA: Diagnosis present

## 2021-06-22 DIAGNOSIS — Z20822 Contact with and (suspected) exposure to covid-19: Secondary | ICD-10-CM | POA: Diagnosis present

## 2021-06-22 DIAGNOSIS — O42013 Preterm premature rupture of membranes, onset of labor within 24 hours of rupture, third trimester: Secondary | ICD-10-CM | POA: Diagnosis present

## 2021-06-22 DIAGNOSIS — Z30017 Encounter for initial prescription of implantable subdermal contraceptive: Secondary | ICD-10-CM

## 2021-06-22 DIAGNOSIS — O26619 Liver and biliary tract disorders in pregnancy, unspecified trimester: Secondary | ICD-10-CM

## 2021-06-22 DIAGNOSIS — O2662 Liver and biliary tract disorders in childbirth: Secondary | ICD-10-CM | POA: Diagnosis present

## 2021-06-22 DIAGNOSIS — O26649 Intrahepatic cholestasis of pregnancy, unspecified trimester: Secondary | ICD-10-CM

## 2021-06-22 DIAGNOSIS — O0990 Supervision of high risk pregnancy, unspecified, unspecified trimester: Secondary | ICD-10-CM

## 2021-06-22 DIAGNOSIS — K831 Obstruction of bile duct: Secondary | ICD-10-CM | POA: Diagnosis present

## 2021-06-22 DIAGNOSIS — O42913 Preterm premature rupture of membranes, unspecified as to length of time between rupture and onset of labor, third trimester: Principal | ICD-10-CM | POA: Diagnosis present

## 2021-06-22 DIAGNOSIS — O24419 Gestational diabetes mellitus in pregnancy, unspecified control: Secondary | ICD-10-CM | POA: Diagnosis present

## 2021-06-22 DIAGNOSIS — Z3A34 34 weeks gestation of pregnancy: Secondary | ICD-10-CM

## 2021-06-22 DIAGNOSIS — O24415 Gestational diabetes mellitus in pregnancy, controlled by oral hypoglycemic drugs: Secondary | ICD-10-CM

## 2021-06-22 NOTE — MAU Provider Note (Signed)
Ms. Suri Tafolla Pershing Cox is a 30 y.o. G2P0101 female at [redacted]w[redacted]d weeks gestation presenting to MAU with complaints of PPROM @ 2140. MAU provider was requested by RN to verify presentation.  NST - FHR: 160 bpm / moderate variability / accels present / decels absent / TOCO: UI noted  Patient informed that the ultrasound is considered a limited OB ultrasound and is not intended to be a complete ultrasound exam.  Patient also informed that the ultrasound is not being completed with the intent of assessing for fetal or placental anomalies or any pelvic abnormalities.  Explained that the purpose of today's ultrasound is to assess for presentation.  Baby was found to be in a cephalic presentation. Patient acknowledges the purpose of the exam and the limitations of the study.  Raelyn Mora, CNM  06/23/2021 12:04 AM

## 2021-06-22 NOTE — MAU Note (Signed)
PT SAYS WITH INTERPRETER- SROM AT 940PM-   PNC WITH CLINIC SOME UC'S. DENIES HSV

## 2021-06-23 ENCOUNTER — Inpatient Hospital Stay (HOSPITAL_COMMUNITY): Payer: Medicaid Other | Admitting: Anesthesiology

## 2021-06-23 ENCOUNTER — Other Ambulatory Visit: Payer: Self-pay

## 2021-06-23 ENCOUNTER — Encounter (HOSPITAL_COMMUNITY): Payer: Self-pay | Admitting: Family Medicine

## 2021-06-23 DIAGNOSIS — Z20822 Contact with and (suspected) exposure to covid-19: Secondary | ICD-10-CM | POA: Diagnosis present

## 2021-06-23 DIAGNOSIS — Z3A34 34 weeks gestation of pregnancy: Secondary | ICD-10-CM

## 2021-06-23 DIAGNOSIS — O326XX0 Maternal care for compound presentation, not applicable or unspecified: Secondary | ICD-10-CM | POA: Diagnosis not present

## 2021-06-23 DIAGNOSIS — K831 Obstruction of bile duct: Secondary | ICD-10-CM | POA: Diagnosis present

## 2021-06-23 DIAGNOSIS — O2662 Liver and biliary tract disorders in childbirth: Secondary | ICD-10-CM | POA: Diagnosis present

## 2021-06-23 DIAGNOSIS — O24425 Gestational diabetes mellitus in childbirth, controlled by oral hypoglycemic drugs: Secondary | ICD-10-CM | POA: Diagnosis present

## 2021-06-23 DIAGNOSIS — O26619 Liver and biliary tract disorders in pregnancy, unspecified trimester: Secondary | ICD-10-CM | POA: Diagnosis present

## 2021-06-23 DIAGNOSIS — Z30017 Encounter for initial prescription of implantable subdermal contraceptive: Secondary | ICD-10-CM | POA: Diagnosis not present

## 2021-06-23 DIAGNOSIS — O42913 Preterm premature rupture of membranes, unspecified as to length of time between rupture and onset of labor, third trimester: Secondary | ICD-10-CM | POA: Diagnosis present

## 2021-06-23 DIAGNOSIS — O43193 Other malformation of placenta, third trimester: Secondary | ICD-10-CM

## 2021-06-23 LAB — PROTEIN / CREATININE RATIO, URINE
Creatinine, Urine: 48.25 mg/dL
Protein Creatinine Ratio: 0.15 mg/mg{Cre} (ref 0.00–0.15)
Total Protein, Urine: 7 mg/dL

## 2021-06-23 LAB — COMPREHENSIVE METABOLIC PANEL
ALT: 26 U/L (ref 0–44)
AST: 30 U/L (ref 15–41)
Albumin: 2.8 g/dL — ABNORMAL LOW (ref 3.5–5.0)
Alkaline Phosphatase: 190 U/L — ABNORMAL HIGH (ref 38–126)
Anion gap: 11 (ref 5–15)
BUN: 11 mg/dL (ref 6–20)
CO2: 20 mmol/L — ABNORMAL LOW (ref 22–32)
Calcium: 9.4 mg/dL (ref 8.9–10.3)
Chloride: 104 mmol/L (ref 98–111)
Creatinine, Ser: 0.7 mg/dL (ref 0.44–1.00)
GFR, Estimated: >60 mL/min (ref >60)
Glucose, Bld: 105 mg/dL — ABNORMAL HIGH (ref 70–99)
Potassium: 3.6 mmol/L (ref 3.5–5.1)
Sodium: 135 mmol/L (ref 135–145)
Total Bilirubin: 0.2 mg/dL — ABNORMAL LOW (ref 0.3–1.2)
Total Protein: 7.2 g/dL (ref 6.5–8.1)

## 2021-06-23 LAB — GLUCOSE, CAPILLARY
Glucose-Capillary: 92 mg/dL (ref 70–99)
Glucose-Capillary: 99 mg/dL (ref 70–99)
Glucose-Capillary: 131 mg/dL — ABNORMAL HIGH (ref 70–99)

## 2021-06-23 LAB — CBC
HCT: 40.9 % (ref 36.0–46.0)
Hemoglobin: 13.6 g/dL (ref 12.0–15.0)
MCH: 29.2 pg (ref 26.0–34.0)
MCHC: 33.3 g/dL (ref 30.0–36.0)
MCV: 87.8 fL (ref 80.0–100.0)
Platelets: 307 10*3/uL (ref 150–400)
RBC: 4.66 MIL/uL (ref 3.87–5.11)
RDW: 13.6 % (ref 11.5–15.5)
WBC: 8.8 10*3/uL (ref 4.0–10.5)
nRBC: 0 % (ref 0.0–0.2)

## 2021-06-23 LAB — RESP PANEL BY RT-PCR (FLU A&B, COVID) ARPGX2
Influenza A by PCR: NEGATIVE
Influenza B by PCR: NEGATIVE
SARS Coronavirus 2 by RT PCR: NEGATIVE

## 2021-06-23 LAB — GROUP B STREP BY PCR: Group B strep by PCR: NEGATIVE

## 2021-06-23 LAB — TYPE AND SCREEN
ABO/RH(D): O POS
Antibody Screen: NEGATIVE

## 2021-06-23 LAB — RPR: RPR Ser Ql: NONREACTIVE

## 2021-06-23 MED ORDER — PENICILLIN G POTASSIUM 5000000 UNITS IJ SOLR: 3.0000 10*6.[IU] | INTRAMUSCULAR | Status: DC

## 2021-06-23 MED ORDER — LACTATED RINGERS IV SOLN: 500.0000 mL | INTRAVENOUS | Status: DC | PRN

## 2021-06-23 MED ORDER — ONDANSETRON HCL 4 MG/2ML IJ SOLN: 4.0000 mg | INTRAMUSCULAR | Status: DC | PRN

## 2021-06-23 MED ORDER — COCONUT OIL OIL
1.0000 "application " | TOPICAL_OIL | Status: DC | PRN
  Administered 2021-06-23: 1 via TOPICAL

## 2021-06-23 MED ORDER — EPHEDRINE 5 MG/ML INJ: 10.0000 mg | INTRAVENOUS | Status: DC | PRN

## 2021-06-23 MED ORDER — PENICILLIN G POTASSIUM 5000000 UNITS IJ SOLR: 5.0000 10*6.[IU] | Freq: Once | INTRAMUSCULAR | Status: DC

## 2021-06-23 MED ORDER — WITCH HAZEL-GLYCERIN EX PADS: 1.0000 "application " | MEDICATED_PAD | CUTANEOUS | Status: DC | PRN

## 2021-06-23 MED ORDER — FENTANYL CITRATE (PF) 100 MCG/2ML IJ SOLN
INTRAMUSCULAR | Status: DC | PRN
  Administered 2021-06-23: 100 ug via EPIDURAL

## 2021-06-23 MED ORDER — ONDANSETRON HCL 4 MG/2ML IJ SOLN: 4.0000 mg | Freq: Four times a day (QID) | INTRAMUSCULAR | Status: DC | PRN

## 2021-06-23 MED ORDER — DIPHENHYDRAMINE HCL 50 MG/ML IJ SOLN: 12.5000 mg | INTRAMUSCULAR | Status: DC | PRN

## 2021-06-23 MED ORDER — SIMETHICONE 80 MG PO CHEW
80.0000 mg | CHEWABLE_TABLET | ORAL | Status: DC | PRN
  Filled 2021-06-23: qty 1

## 2021-06-23 MED ORDER — ONDANSETRON HCL 4 MG PO TABS: 4.0000 mg | ORAL_TABLET | ORAL | Status: DC | PRN

## 2021-06-23 MED ORDER — BUPIVACAINE HCL (PF) 0.25 % IJ SOLN
INTRAMUSCULAR | Status: DC | PRN
  Administered 2021-06-23: 8 mL via EPIDURAL

## 2021-06-23 MED ORDER — FENTANYL-BUPIVACAINE-NACL 0.5-0.125-0.9 MG/250ML-% EP SOLN
12.0000 mL/h | EPIDURAL | Status: DC | PRN
  Filled 2021-06-23: qty 250

## 2021-06-23 MED ORDER — TETANUS-DIPHTH-ACELL PERTUSSIS 5-2.5-18.5 LF-MCG/0.5 IM SUSY: 0.5000 mL | PREFILLED_SYRINGE | Freq: Once | INTRAMUSCULAR | Status: DC

## 2021-06-23 MED ORDER — FENTANYL-BUPIVACAINE-NACL 0.5-0.125-0.9 MG/250ML-% EP SOLN
EPIDURAL | Status: DC | PRN
  Administered 2021-06-23: 12 mL/h via EPIDURAL

## 2021-06-23 MED ORDER — ACETAMINOPHEN 325 MG PO TABS: 650.0000 mg | ORAL_TABLET | ORAL | Status: DC | PRN

## 2021-06-23 MED ORDER — LACTATED RINGERS IV SOLN
500.0000 mL | Freq: Once | INTRAVENOUS | Status: AC
  Administered 2021-06-23: 500 mL via INTRAVENOUS

## 2021-06-23 MED ORDER — MISOPROSTOL 50MCG HALF TABLET
50.0000 ug | ORAL_TABLET | ORAL | Status: DC | PRN
  Administered 2021-06-23: 50 ug via BUCCAL
  Filled 2021-06-23: qty 1

## 2021-06-23 MED ORDER — PHENYLEPHRINE 40 MCG/ML (10ML) SYRINGE FOR IV PUSH (FOR BLOOD PRESSURE SUPPORT)
80.0000 ug | PREFILLED_SYRINGE | INTRAVENOUS | Status: DC | PRN
  Filled 2021-06-23: qty 10

## 2021-06-23 MED ORDER — TERBUTALINE SULFATE 1 MG/ML IJ SOLN: 0.2500 mg | Freq: Once | INTRAMUSCULAR | Status: DC | PRN

## 2021-06-23 MED ORDER — BENZOCAINE-MENTHOL 20-0.5 % EX AERO
1.0000 "application " | INHALATION_SPRAY | CUTANEOUS | Status: DC | PRN
  Administered 2021-06-23: 1 via TOPICAL
  Filled 2021-06-23 (×2): qty 56

## 2021-06-23 MED ORDER — DIBUCAINE (PERIANAL) 1 % EX OINT: 1.0000 "application " | TOPICAL_OINTMENT | CUTANEOUS | Status: DC | PRN

## 2021-06-23 MED ORDER — ZOLPIDEM TARTRATE 5 MG PO TABS: 5.0000 mg | ORAL_TABLET | Freq: Every evening | ORAL | Status: DC | PRN

## 2021-06-23 MED ORDER — IBUPROFEN 600 MG PO TABS
600.0000 mg | ORAL_TABLET | Freq: Four times a day (QID) | ORAL | Status: DC
  Administered 2021-06-23 – 2021-06-25 (×6): 600 mg via ORAL
  Filled 2021-06-23 (×6): qty 1

## 2021-06-23 MED ORDER — OXYTOCIN-SODIUM CHLORIDE 30-0.9 UT/500ML-% IV SOLN
2.5000 [IU]/h | INTRAVENOUS | Status: DC
  Filled 2021-06-23: qty 500

## 2021-06-23 MED ORDER — SODIUM CHLORIDE 0.9 % IV SOLN
5.0000 10*6.[IU] | Freq: Once | INTRAVENOUS | Status: AC
  Administered 2021-06-23: 5 10*6.[IU] via INTRAVENOUS
  Filled 2021-06-23: qty 5

## 2021-06-23 MED ORDER — OXYTOCIN BOLUS FROM INFUSION
333.0000 mL | Freq: Once | INTRAVENOUS | Status: AC
  Administered 2021-06-23: 333 mL via INTRAVENOUS

## 2021-06-23 MED ORDER — LIDOCAINE HCL (PF) 1 % IJ SOLN
INTRAMUSCULAR | Status: DC | PRN
  Administered 2021-06-23: 10 mL via EPIDURAL
  Administered 2021-06-23: 2 mL via EPIDURAL

## 2021-06-23 MED ORDER — FENTANYL CITRATE (PF) 100 MCG/2ML IJ SOLN
50.0000 ug | INTRAMUSCULAR | Status: DC | PRN
  Filled 2021-06-23: qty 2

## 2021-06-23 MED ORDER — SENNOSIDES-DOCUSATE SODIUM 8.6-50 MG PO TABS
2.0000 | ORAL_TABLET | ORAL | Status: DC
  Administered 2021-06-23 – 2021-06-24 (×2): 2 via ORAL
  Filled 2021-06-23 (×2): qty 2

## 2021-06-23 MED ORDER — PRENATAL MULTIVITAMIN CH
1.0000 | ORAL_TABLET | Freq: Every day | ORAL | Status: DC
  Administered 2021-06-24 – 2021-06-25 (×2): 1 via ORAL
  Filled 2021-06-23 (×2): qty 1

## 2021-06-23 MED ORDER — PENICILLIN G POT IN DEXTROSE 60000 UNIT/ML IV SOLN
3.0000 10*6.[IU] | INTRAVENOUS | Status: DC
  Administered 2021-06-23 (×2): 3 10*6.[IU] via INTRAVENOUS
  Filled 2021-06-23 (×3): qty 50

## 2021-06-23 MED ORDER — LACTATED RINGERS IV SOLN: INTRAVENOUS | Status: DC

## 2021-06-23 MED ORDER — DIPHENHYDRAMINE HCL 25 MG PO CAPS: 25.0000 mg | ORAL_CAPSULE | Freq: Four times a day (QID) | ORAL | Status: DC | PRN

## 2021-06-23 MED ORDER — LIDOCAINE HCL (PF) 1 % IJ SOLN: 30.0000 mL | INTRAMUSCULAR | Status: DC | PRN

## 2021-06-23 MED ORDER — PHENYLEPHRINE 40 MCG/ML (10ML) SYRINGE FOR IV PUSH (FOR BLOOD PRESSURE SUPPORT): 80.0000 ug | PREFILLED_SYRINGE | INTRAVENOUS | Status: DC | PRN

## 2021-06-23 MED ORDER — OXYTOCIN-SODIUM CHLORIDE 30-0.9 UT/500ML-% IV SOLN
1.0000 m[IU]/min | INTRAVENOUS | Status: DC
  Administered 2021-06-23: 2 m[IU]/min via INTRAVENOUS

## 2021-06-23 MED ORDER — SOD CITRATE-CITRIC ACID 500-334 MG/5ML PO SOLN: 30.0000 mL | ORAL | Status: DC | PRN

## 2021-06-23 NOTE — Anesthesia Procedure Notes (Signed)
Epidural Patient location during procedure: OB Start time: 06/23/2021 3:37 PM End time: 06/23/2021 3:49 PM  Staffing Anesthesiologist: Lannie Fields, DO Performed: anesthesiologist   Preanesthetic Checklist Completed: patient identified, IV checked, risks and benefits discussed, monitors and equipment checked, pre-op evaluation and timeout performed  Epidural Patient position: sitting Prep: DuraPrep and site prepped and draped Patient monitoring: continuous pulse ox, blood pressure, heart rate and cardiac monitor Approach: midline Location: L3-L4 Injection technique: LOR air  Needle:  Needle type: Tuohy  Needle gauge: 17 G Needle length: 9 cm Needle insertion depth: 7 cm Catheter type: closed end flexible Catheter size: 19 Gauge Catheter at skin depth: 12 cm Test dose: negative  Assessment Sensory level: T8 Events: blood not aspirated, injection not painful, no injection resistance, no paresthesia and negative IV test  Additional Notes Patient identified. Risks/Benefits/Options discussed with patient including but not limited to bleeding, infection, nerve damage, paralysis, failed block, incomplete pain control, headache, blood pressure changes, nausea, vomiting, reactions to medication both or allergic, itching and postpartum back pain. Confirmed with bedside nurse the patient's most recent platelet count. Confirmed with patient that they are not currently taking any anticoagulation, have any bleeding history or any family history of bleeding disorders. Patient expressed understanding and wished to proceed. All questions were answered. Sterile technique was used throughout the entire procedure. Please see nursing notes for vital signs. Test dose was given through epidural catheter and negative prior to continuing to dose epidural or start infusion. Warning signs of high block given to the patient including shortness of breath, tingling/numbness in hands, complete motor block,  or any concerning symptoms with instructions to call for help. Patient was given instructions on fall risk and not to get out of bed. All questions and concerns addressed with instructions to call with any issues or inadequate analgesia.  Reason for block:procedure for pain

## 2021-06-23 NOTE — H&P (Addendum)
OBSTETRIC ADMISSION HISTORY AND PHYSICAL  Carrie Delacruz is a 30 y.o. female G2P0101 with IUP at [redacted]w[redacted]d by 22 wk [place] presenting for PPROM. She reports loss of fluid around 9pm and contractions that started an hour or 2 later. She reports +FMs, No VB, no blurry vision, headaches or peripheral edema, and RUQ pain.  She plans on breast and bottle feeding. She request Nexplanon for birth control. She received her [facility]   Dating: By 22 wk [place] --->  Estimated Date of Delivery: 08/01/21  Sono:    @[redacted]w[redacted]d , CWD, normal anatomy, cephalic presentation, Fundal placenta, 946g, 36% EFW   Prenatal History/Complications:  -Gestational Diabetes: metformin -Intrahepatic cholestasis  Past Medical History: Past Medical History:  Diagnosis Date   Cholestasis    Metrorrhagia     Past Surgical History: Past Surgical History:  Procedure Laterality Date   NO PAST SURGERIES      Obstetrical History: OB History     Gravida  2   Para  1   Term      Preterm  1   AB      Living  1      SAB      IAB      Ectopic      Multiple      Live Births  1           Social History Social History   Socioeconomic History   Marital status: Single    Spouse name: Not on file   Number of children: Not on file   Years of education: Not on file   Highest education level: Not on file  Occupational History   Not on file  Tobacco Use   Smoking status: Never   Smokeless tobacco: Never  Vaping Use   Vaping Use: Never used  Substance and Sexual Activity   Alcohol use: Never   Drug use: Never   Sexual activity: Yes    Birth control/protection: None    Comment: Depo Provera  Other Topics Concern   Not on file  Social History Narrative   Not on file   Social Determinants of Health   Financial Resource Strain: Not on file  Food Insecurity: Food Insecurity Present   Worried About Running Out of Food in the Last Year: Sometimes true   Ran Out of Food in the  Last Year: Sometimes true  Transportation Needs: No Transportation Needs   Lack of Transportation (Medical): No   Lack of Transportation (Non-Medical): No  Physical Activity: Not on file  Stress: Not on file  Social Connections: Not on file    Family History: Family History  Problem Relation Age of Onset   Diabetes Mother    Asthma Mother     Allergies: No Known Allergies  Medications Prior to Admission  Medication Sig Dispense Refill Last Dose   metFORMIN (GLUCOPHAGE) 500 MG tablet Take 1 tablet (500 mg total) by mouth 2 (two) times daily with a meal. 180 tablet 1 06/23/2021   Prenatal Vit-Fe Fumarate-FA (PRENATAL MULTIVITAMIN) TABS tablet Take 1 tablet by mouth daily at 12 noon.   06/22/2021   ursodiol (ACTIGALL) 500 MG tablet Take 1 tablet (500 mg total) by mouth 2 (two) times daily. 60 tablet 3 06/22/2021   acetaminophen (TYLENOL) 500 MG tablet Take 500 mg by mouth every 6 (six) hours as needed.      hydrOXYzine (ATARAX/VISTARIL) 25 MG tablet Take 1-2 tablets (25-50 mg total) by mouth every 6 (  six) hours as needed for itching. 60 tablet 2      Review of Systems   All systems reviewed and negative except as stated in HPI  Blood pressure 122/76, pulse 88, temperature 99 F (37.2 C), temperature source Oral, resp. rate 20, weight 66.9 kg, last menstrual period 11/18/2020. General appearance: alert and cooperative Lungs: clear to auscultation bilaterally Heart: regular rate and rhythm Abdomen: soft, non-tender; bowel sounds normal Pelvic: 1-2/50/-2 Extremities: Homans sign is negative, no sign of DVT Presentation: cephalic Fetal monitoringBaseline: 135 bpm, Variability: Good {> 6 bpm), Accelerations: Reactive, and Decelerations: Absent Uterine activity: q2-3 min     Prenatal labs: ABO, Rh: --/--/O POS (08/09 0025) Antibody: NEG (08/09 0025) Rubella: 32.80 (03/16 0946) RPR: Non Reactive (07/01 0857)  HBsAg: Negative (03/16 0946)  HIV: Non Reactive (07/01 0857)  GBS:    Unknown 1 hr Glucola: 86, 212,196 Genetic screening: Low Risk Anatomy US: Normal  Prenatal Transfer Tool  Maternal Diabetes: Yes:  Diabetes Type:  Insulin/Medication controlled Genetic Screening: Normal Maternal Ultrasounds/Referrals: Normal Fetal Ultrasounds or other Referrals:  None Maternal Substance Abuse:  No Significant Maternal Medications:  Meds include: Other: Metformin Significant Maternal Lab Results: Other: GBS unknown  Results for orders placed or performed during the hospital encounter of 06/22/21 (from the past 24 hour(s))  Type and screen   Collection Time: 06/23/21 12:25 AM  Result Value Ref Range   ABO/RH(D) O POS    Antibody Screen NEG    Sample Expiration      06/26/2021,2359 Performed at [facility], [address], [place], [place] [address]   CBC   Collection Time: 06/23/21 12:38 AM  Result Value Ref Range   WBC 8.8 4.0 - 10.5 K/uL   RBC 4.66 3.87 - 5.11 MIL/uL   Hemoglobin 13.6 12.0 - 15.0 g/dL   HCT 38.7 56.4 - 33.2 %   MCV 87.8 80.0 - 100.0 fL   MCH 29.2 26.0 - 34.0 pg   MCHC 33.3 30.0 - 36.0 g/dL   RDW 95.1 88.4 - 16.6 %   Platelets 307 150 - 400 K/uL   nRBC 0.0 0.0 - 0.2 %  Comprehensive metabolic panel   Collection Time: 06/23/21 12:38 AM  Result Value Ref Range   Sodium 135 135 - 145 mmol/L   Potassium 3.6 3.5 - 5.1 mmol/L   Chloride 104 98 - 111 mmol/L   CO2 20 (L) 22 - 32 mmol/L   Glucose, Bld 105 (H) 70 - 99 mg/dL   BUN 11 6 - 20 mg/dL   Creatinine, Ser 0.63 0.44 - 1.00 mg/dL   Calcium 9.4 8.9 - 01.6 mg/dL   Total Protein 7.2 6.5 - 8.1 g/dL   Albumin 2.8 (L) 3.5 - 5.0 g/dL   AST 30 15 - 41 U/L   ALT 26 0 - 44 U/L   Alkaline Phosphatase 190 (H) 38 - 126 U/L   Total Bilirubin 0.2 (L) 0.3 - 1.2 mg/dL   GFR, Estimated >01 >09 mL/min   Anion gap 11 5 - 15    Patient Active Problem List   Diagnosis Date Noted   Cholestasis of pregnancy 06/23/2021   Intrahepatic cholestasis of pregnancy, antepartum 05/17/2021   Gestational  diabetes mellitus (GDM) in third trimester 05/16/2021   Pruritus of pregnancy in third trimester 05/15/2021   Supervision of high risk pregnancy, antepartum 01/28/2021   History of cholestasis during pregnancy 01/28/2021    Assessment/Plan:  Karie Schwalbe Pershing Delacruz is a 31 y.o. [phone]  at [redacted]w[redacted]d here for PPROM  #Labor: Making some change after PPROM, will continue expectant management but if contractions space out will give buccal cytotec #Pain: Epidural as desired vs PRN IV pain medications #FWB: Cat 1 #ID:  GBS unknown - start PCN #MOF: Breast and bottle #MOC:Nexplanon #Circ:  No #A2GDM: q4 CBGs #Intrahepatic cholestasis: monitor  Nelson Chimes, MD  06/23/2021, 2:05 AM

## 2021-06-23 NOTE — Discharge Summary (Signed)
Postpartum Discharge Summary     Patient Name: Carrie Delacruz DOB: Jun 14, 1991 MRN: [id number]  Date of admission: 06/22/2021 Delivery date:06/23/2021  Delivering provider: Gladys Damme  Date of discharge: 06/25/2021  Admitting diagnosis: Cholestasis of pregnancy [O26.619, K83.1] Intrauterine pregnancy: [redacted]w[redacted]d    Secondary diagnosis:  Principal Problem:   Vaginal delivery Active Problems:   Gestational diabetes mellitus (GDM) in third trimester   Preterm premature rupture of membranes (PPROM) with onset of labor within 24 hours of rupture in third trimester, antepartum  Additional problems: None    Discharge diagnosis: Preterm Pregnancy Delivered                                              Post partum procedures: Nexplanon Augmentation: Pitocin and Cytotec Complications: None  Hospital course: Onset of Labor With Vaginal Delivery      30y.o. yo G2P0101 at 332w3das admitted in Latent Labor on 06/22/2021 after PPROM. Patient had an uncomplicated labor course as follows:  Membrane Rupture Time/Date: 9:30 PM ,06/22/2021   Delivery Method:Vaginal, Spontaneous  Episiotomy: None  Lacerations:  Labial  Patient had an uncomplicated postpartum course.  She is ambulating, tolerating a regular diet, passing flatus, and urinating well. Nexplanon placed on PPD#2.  Patient is discharged home in stable condition on 06/25/21.  Newborn Data: Birth date:06/23/2021  Birth time:6:04 PM  Gender:Female  Living status:Living  Apgars:9 ,9  Weight:2310 g   Magnesium Sulfate received: No BMZ received: No Rhophylac:N/A MMR:N/A T-DaP:Given prenatally Flu: N/A - given prenatally Transfusion:No  Physical exam  Vitals:   06/24/21 2349 06/25/21 0313 06/25/21 0850 06/25/21 1334  BP: 113/82 130/86 97/77 (!) 114/58  Pulse: 68 75 68 67  Resp: _0 Temp: 98.5 F (36.9 C) 98.3 F (36.8 C) 98.4 F (36.9 C) 98.3 F (36.8 C)  TempSrc: Oral Oral Oral Oral  SpO2: 100% 100% 99%  98%  Weight:      Height:       General: alert, cooperative, and no distress Lochia: appropriate Uterine Fundus: firm DVT Evaluation: No evidence of DVT seen on physical exam. Negative Homan's sign. No cords or calf tenderness. Labs: Lab Results  Component Value Date   WBC 8.8 06/23/2021   HGB 13.6 06/23/2021   HCT 40.9 06/23/2021   MCV 87.8 06/23/2021   PLT 307 06/23/2021   CMP Latest Ref Rng & Units 06/24/2021  Glucose 70 - 99 mg/dL 94  BUN 6 - 20 mg/dL -  Creatinine 0.44 - 1.00 mg/dL -  Sodium 135 - 145 mmol/L -  Potassium 3.5 - 5.1 mmol/L -  Chloride 98 - 111 mmol/L -  CO2 22 - 32 mmol/L -  Calcium 8.9 - 10.3 mg/dL -  Total Protein 6.5 - 8.1 g/dL -  Total Bilirubin 0.3 - 1.2 mg/dL -  Alkaline Phos 38 - 126 U/L -  AST 15 - 41 U/L -  ALT 0 - 44 U/L -   Edinburgh Score: No flowsheet data found.   After visit meds:  Allergies as of 06/25/2021   No Known Allergies      Medication List     STOP taking these medications    hydrOXYzine 25 MG tablet Commonly known as: ATARAX/VISTARIL   metFORMIN 500 MG tablet Commonly known as: Glucophage   ursodiol 500 MG tablet Commonly known as: ACTIGALL  TAKE these medications    acetaminophen 500 MG tablet Commonly known as: TYLENOL Take 2 tablets (1,000 mg total) by mouth every 6 (six) hours as needed for mild pain. What changed:  how much to take reasons to take this   ibuprofen 600 MG tablet Commonly known as: ADVIL Take 1 tablet (600 mg total) by mouth every 6 (six) hours as needed for moderate pain, headache or cramping.   prenatal multivitamin Tabs tablet Take 1 tablet by mouth daily at 12 noon.   senna-docusate 8.6-50 MG tablet Commonly known as: Senokot-S Take 2 tablets by mouth at bedtime as needed for moderate constipation.         Discharge home in stable condition Infant Feeding: Breast Infant Disposition:NICU Discharge instruction: per After Visit Summary and Postpartum  booklet. Activity: Advance as tolerated. Pelvic rest for 6 weeks.  Diet: routine diet Future Appointments:   Follow up Visit: Please schedule this patient for a In person postpartum visit in 4 weeks with the following provider: Any provider. Additional Postpartum F/U:2 hour GTT  High risk pregnancy complicated by: GDM and cholestasis Delivery mode:  Vaginal, Spontaneous  Anticipated Birth Control:  Nexplanon placed in hospital   06/25/2021 Verita Schneiders, MD

## 2021-06-23 NOTE — Progress Notes (Addendum)
Carrie Delacruz is a 30 y.o. G2P0101 at [redacted]w[redacted]d by ultrasound admitted for PPROM.   Subjective: Patient is starting to become more uncomfortable and is currently getting her epidural.   Objective: BP 110/78   Pulse 76   Temp 98.5 F (36.9 C) (Oral)   Resp 16   Ht 5' (1.524 m)   Wt 66.9 kg   LMP 11/18/2020 (Approximate)   BMI 28.79 kg/m  No intake/output data recorded. No intake/output data recorded.  FHT:  FHR: 145 bpm, variability: moderate,  accelerations:  Present,  decelerations:  Present early UC:   regular, every 1.5-3 minutes SVE:   Dilation: 4 Effacement (%): 50 Station: -3 Exam by:: Earlene Plater, RN  Labs: Lab Results  Component Value Date   WBC 8.8 06/23/2021   HGB 13.6 06/23/2021   HCT 40.9 06/23/2021   MCV 87.8 06/23/2021   PLT 307 06/23/2021    Assessment / Plan: 30 yo G2P0101 presenting for PPROM.  Labor: Progressing normally. Given 1 dose of buccal Cytotec 50 mg at 0934. Started on Pitocin 64mL/hr at 1450. Contracting regularly.  Fetal Wellbeing:  Category I Pain Control:  Epidural and IV pain meds I/D:   GBS unknown, treating with prophylactic PCN  Anticipated MOD:  NSVD  Athena Masse 06/23/2021, 3:23 PM  GME ATTESTATION:  I saw and evaluated the patient. I agree with the findings and the plan of care as documented in the student's note. I have made changes to documentation as necessary.   Evalina Field, MD OB Fellow, Faculty [facility], [facility] 06/23/2021 8:51 PM

## 2021-06-23 NOTE — Anesthesia Preprocedure Evaluation (Signed)
Anesthesia Evaluation  Patient identified by MRN, date of birth, ID band Patient awake    Reviewed: Allergy & Precautions, Patient's Chart, lab work & pertinent test results  Airway Mallampati: II  TM Distance: >3 FB Neck ROM: Full    Dental no notable dental hx.    Pulmonary neg pulmonary ROS,    Pulmonary exam normal breath sounds clear to auscultation       Cardiovascular negative cardio ROS Normal cardiovascular exam Rhythm:Regular Rate:Normal     Neuro/Psych negative neurological ROS  negative psych ROS   GI/Hepatic negative GI ROS, Neg liver ROS,   Endo/Other  diabetes, Well Controlled, Gestational, Oral Hypoglycemic Agents  Renal/GU negative Renal ROS  negative genitourinary   Musculoskeletal negative musculoskeletal ROS (+)   Abdominal   Peds  Hematology negative hematology ROS (+) hct 40.9, plt 307   Anesthesia Other Findings   Reproductive/Obstetrics (+) Pregnancy                             Anesthesia Physical Anesthesia Plan  ASA: 2  Anesthesia Plan: Epidural   Post-op Pain Management:    Induction:   PONV Risk Score and Plan:   Airway Management Planned: Natural Airway  Additional Equipment: None  Intra-op Plan:   Post-operative Plan:   Informed Consent: I have reviewed the patients History and Physical, chart, labs and discussed the procedure including the risks, benefits and alternatives for the proposed anesthesia with the patient or authorized representative who has indicated his/her understanding and acceptance.       Plan Discussed with:   Anesthesia Plan Comments:         Anesthesia Quick Evaluation

## 2021-06-23 NOTE — Progress Notes (Signed)
Carrie Delacruz is a 30 y.o. G2P0101 at [redacted]w[redacted]d by admitted for PROM  Subjective: Pt resting comfortably.  Objective: BP 118/89   Pulse 85   Temp 97.7 F (36.5 C) (Oral)   Resp 16   Ht 5' (1.524 m)   Wt 66.9 kg   LMP 11/18/2020 (Approximate)   BMI 28.79 kg/m  No intake/output data recorded. No intake/output data recorded.  FHT:  FHR: 150 bpm, variability: moderate,  accelerations:  Present,  decelerations:  Absent UC:   regular, every 5 minutes SVE:   2/50/-3 by Dr Mathis Fare Dilation: 1.5 Effacement (%): 50 Station: -2 Exam by:: Dorma Russell, MD  Labs: Lab Results  Component Value Date   WBC 8.8 06/23/2021   HGB 13.6 06/23/2021   HCT 40.9 06/23/2021   MCV 87.8 06/23/2021   PLT 307 06/23/2021    Assessment / Plan: 30 yo G1P0101 presents for PPROM and augmentation of labor.  Labor: Patient making some change.  Contractions petered out. Cytotec 50 mg buccal given. Recheck 4 hours. A2GDM: q4h CBG, last 99. Continue to monitor. Intrahepatic cholestasis: no pain, continue to monitor Fetal Wellbeing:  Category I Pain Control:  Epidural and IV pain meds I/D:   GBS unknown, treated with PCN Anticipated MOD:  NSVD  Shirlean Mylar 06/23/2021, 9:24 AM

## 2021-06-24 LAB — GLUCOSE, RANDOM: Glucose, Bld: 94 mg/dL (ref 70–99)

## 2021-06-24 NOTE — Anesthesia Postprocedure Evaluation (Signed)
Anesthesia Post Note  Patient: Carrie Delacruz  Procedure(s) Performed: AN AD HOC LABOR EPIDURAL     Patient location during evaluation: Mother Baby Anesthesia Type: Epidural Level of consciousness: awake and alert Pain management: pain level controlled Vital Signs Assessment: post-procedure vital signs reviewed and stable Respiratory status: spontaneous breathing, nonlabored ventilation and respiratory function stable Cardiovascular status: stable Postop Assessment: no headache, no backache and epidural receding Anesthetic complications: no   No notable events documented.  Last Vitals:  Vitals:   06/24/21 0737 06/24/21 0756  BP: (!) 90/58 95/66  Pulse: (!) 57 (!) 58  Resp:  18  Temp:  36.7 C  SpO2:  98%    Last Pain:  Vitals:   06/24/21 0756  TempSrc: Oral  PainSc:    Pain Goal: Patients Stated Pain Goal: 3 (06/24/21 0559)                 Fanny Dance

## 2021-06-24 NOTE — Progress Notes (Signed)
Patient screened out for psychosocial assessment since none of the following apply: °Psychosocial stressors documented in mother or baby's chart °Gestation less than 32 weeks °Code at delivery  °Infant with anomalies °Please contact the Clinical Social Worker if specific needs arise, by MOB's request, or if MOB scores greater than 9/yes to question 10 on Edinburgh Postpartum Depression Screen. ° °Ell Tiso Boyd-Gilyard, MSW, LCSW °Clinical Social Work °[phone] °  °

## 2021-06-24 NOTE — Lactation Note (Signed)
This note was copied from a baby's chart. Lactation Consultation Note LC completed initial consult with interpreter assistance. Mother has initiated pumping and is aware of need to pump 15 minutes q3. We reviewed IDF-1 "lick and learn." Mother is aware of LC services. Referral sent to [facility] for loaner pump.  Patient Name: [name] [id number] Date: 06/24/2021 Reason for consult: Initial assessment;NICU baby Age:30 hours  Maternal Data Has patient been taught Hand Expression?: Yes Does the patient have breastfeeding experience prior to this delivery?: Yes How long did the patient breastfeed?: 2 years with last baby  Feeding Mother's Current Feeding Choice: Breast Milk and Formula  Lactation Tools Discussed/Used Tools: Pump Pump Education: Setup, frequency, and cleaning;Milk Storage  Interventions Interventions: Breast feeding basics reviewed;Education NICU book and LC brochure  Consult Status Consult Status: Follow-up Follow-up type: In-patient   Elder Negus, MA IBCLC 06/24/2021, 4:24 PM

## 2021-06-24 NOTE — Progress Notes (Signed)
Post Partum Day 1 Subjective: No complaints, up ad lib, voiding, and tolerating PO. Baby is stable in NICU.  Patient is Spanish-speaking only, interpreter present for this encounter.  Objective: Blood pressure 103/73, pulse 63, temperature 98 F (36.7 C), temperature source Oral, resp. rate 18, height 5' (1.524 m), weight 66.7 kg, last menstrual period 11/18/2020, SpO2 98 %, unknown if currently breastfeeding.  Physical Exam:  General: alert and no distress Lochia: appropriate Uterine Fundus: firm DVT Evaluation: No evidence of DVT seen on physical exam. Negative Homan's sign. No cords or calf tenderness.  Recent Labs    06/23/21 0038  HGB 13.6  HCT 40.9   Assessment/Plan: Plan for discharge tomorrow and Breastfeeding Desires Nexplanon Normal postpartum care   LOS: 1 day   Jaynie Collins, MD 06/24/2021, 11:36 AM

## 2021-06-24 NOTE — Plan of Care (Signed)
  Problem: Activity: Goal: Risk for activity intolerance will decrease Outcome: Completed/Met   Problem: Coping: Goal: Level of anxiety will decrease Outcome: Completed/Met   Problem: Elimination: Goal: Will not experience complications related to urinary retention Outcome: Completed/Met   Problem: Activity: Goal: Ability to tolerate increased activity will improve Outcome: Completed/Met

## 2021-06-25 ENCOUNTER — Other Ambulatory Visit: Payer: Self-pay

## 2021-06-25 ENCOUNTER — Encounter: Payer: Self-pay | Admitting: Obstetrics & Gynecology

## 2021-06-25 DIAGNOSIS — O42013 Preterm premature rupture of membranes, onset of labor within 24 hours of rupture, third trimester: Secondary | ICD-10-CM | POA: Diagnosis present

## 2021-06-25 DIAGNOSIS — Z30017 Encounter for initial prescription of implantable subdermal contraceptive: Secondary | ICD-10-CM

## 2021-06-25 LAB — CULTURE, BETA STREP (GROUP B ONLY)

## 2021-06-25 MED ORDER — SENNOSIDES-DOCUSATE SODIUM 8.6-50 MG PO TABS
2.0000 | ORAL_TABLET | Freq: Every evening | ORAL | 2 refills | Status: DC | PRN
  Filled 2021-06-25: qty 30, 15d supply, fill #0

## 2021-06-25 MED ORDER — IBUPROFEN 600 MG PO TABS
600.0000 mg | ORAL_TABLET | Freq: Four times a day (QID) | ORAL | 2 refills | Status: DC | PRN
  Filled 2021-06-25: qty 30, 8d supply, fill #0

## 2021-06-25 MED ORDER — ACETAMINOPHEN 500 MG PO TABS
1000.0000 mg | ORAL_TABLET | Freq: Four times a day (QID) | ORAL | 2 refills | Status: AC | PRN
  Filled 2021-06-25: qty 60, 8d supply, fill #0

## 2021-06-25 MED ORDER — LIDOCAINE HCL 1 % IJ SOLN
0.0000 mL | Freq: Once | INTRAMUSCULAR | Status: AC | PRN
  Administered 2021-06-25: 3 mL via INTRADERMAL
  Filled 2021-06-25: qty 20

## 2021-06-25 MED ORDER — ETONOGESTREL 68 MG ~~LOC~~ IMPL
68.0000 mg | DRUG_IMPLANT | Freq: Once | SUBCUTANEOUS | Status: AC
  Administered 2021-06-25: 68 mg via SUBCUTANEOUS
  Filled 2021-06-25 (×2): qty 1

## 2021-06-25 NOTE — Progress Notes (Signed)
At 1400 consent and timeout completed with Dr. Macon Large. Interpreter at bedside. Nexplanon placed in left arm. Pt instructed to keep wrap that was placed over site on arm until tomorrow.

## 2021-06-25 NOTE — Procedures (Signed)
     INPATIENT PROCEDURE NOTE  Carrie Delacruz is a 30 y.o. [id number] PPD#2 s/p vaginal delivery here for Nexplanon insertion.   No other concerns.  Patient is Spanish-speaking only, interpreter present for this encounter.  Nexplanon Insertion Procedure Patient identified, informed consent performed, consent signed.   Patient does understand that irregular bleeding is a very common side effect of this medication. Other side effects discussed in detail, all questions answered.   Appropriate time out taken.  Patient's left arm was prepped and draped in the usual sterile fashion. The ruler used to measure and mark insertion area.  Patient was prepped with alcohol swab and then injected with 3 ml of 1% lidocaine.  She was prepped with betadine, Nexplanon removed from packaging,  Device confirmed in needle, then inserted full length of needle and withdrawn per handbook instructions. Nexplanon was able to palpated in the patient's arm; patient palpated the insert herself. There was minimal blood loss.  Patient insertion site covered with guaze and a pressure bandage to reduce any bruising.  The patient tolerated the procedure well and was given post procedure instructions.   Patient will be discharged to home soon and follow up for postpartum appointments.      Jaynie Collins, MD, FACOG Obstetrician & Gynecologist, [facility] for [employer], [facility]

## 2021-06-25 NOTE — Progress Notes (Signed)
Discharge instructions completed, all questions answered, and pt verbalizes understanding. Pt is alert and oriented x4 and pt is ambulatory. Pt states no pain and VS stable.

## 2021-06-25 NOTE — Plan of Care (Signed)
  Problem: Education: Goal: Knowledge of General Education information will improve Description: Including pain rating scale, medication(s)/side effects and non-pharmacologic comfort measures Outcome: Adequate for Discharge   Problem: Health Behavior/Discharge Planning: Goal: Ability to manage health-related needs will improve Outcome: Adequate for Discharge   Problem: Clinical Measurements: Goal: Ability to maintain clinical measurements within normal limits will improve Outcome: Adequate for Discharge Goal: Will remain free from infection Outcome: Adequate for Discharge Goal: Diagnostic test results will improve Outcome: Adequate for Discharge Goal: Respiratory complications will improve Outcome: Adequate for Discharge Goal: Cardiovascular complication will be avoided Outcome: Adequate for Discharge   Problem: Nutrition: Goal: Adequate nutrition will be maintained Outcome: Adequate for Discharge   Problem: Elimination: Goal: Will not experience complications related to bowel motility Outcome: Adequate for Discharge   Problem: Pain Managment: Goal: General experience of comfort will improve Outcome: Adequate for Discharge   Problem: Safety: Goal: Ability to remain free from injury will improve Outcome: Adequate for Discharge   Problem: Skin Integrity: Goal: Risk for impaired skin integrity will decrease Outcome: Adequate for Discharge   Problem: Education: Goal: Ability to describe self-care measures that may prevent or decrease complications (Diabetes Survival Skills Education) will improve Outcome: Adequate for Discharge Goal: Individualized Educational Video(s) Outcome: Adequate for Discharge   Problem: Coping: Goal: Ability to adjust to condition or change in health will improve Outcome: Adequate for Discharge   Problem: Fluid Volume: Goal: Ability to maintain a balanced intake and output will improve Outcome: Adequate for Discharge   Problem: Health  Behavior/Discharge Planning: Goal: Ability to identify and utilize available resources and services will improve Outcome: Adequate for Discharge Goal: Ability to manage health-related needs will improve Outcome: Adequate for Discharge   Problem: Metabolic: Goal: Ability to maintain appropriate glucose levels will improve Outcome: Adequate for Discharge   Problem: Nutritional: Goal: Maintenance of adequate nutrition will improve Outcome: Adequate for Discharge Goal: Progress toward achieving an optimal weight will improve Outcome: Adequate for Discharge   Problem: Skin Integrity: Goal: Risk for impaired skin integrity will decrease Outcome: Adequate for Discharge   Problem: Tissue Perfusion: Goal: Adequacy of tissue perfusion will improve Outcome: Adequate for Discharge   Problem: Education: Goal: Knowledge of condition will improve Outcome: Adequate for Discharge Goal: Individualized Educational Video(s) Outcome: Adequate for Discharge Goal: Individualized Newborn Educational Video(s) Outcome: Adequate for Discharge   Problem: Activity: Goal: Will verbalize the importance of balancing activity with adequate rest periods Outcome: Adequate for Discharge   Problem: Coping: Goal: Ability to identify and utilize available resources and services will improve Outcome: Adequate for Discharge   Problem: Life Cycle: Goal: Chance of risk for complications during the postpartum period will decrease Outcome: Adequate for Discharge   Problem: Role Relationship: Goal: Ability to demonstrate positive interaction with newborn will improve Outcome: Adequate for Discharge   Problem: Skin Integrity: Goal: Demonstration of wound healing without infection will improve Outcome: Adequate for Discharge

## 2021-06-25 NOTE — Lactation Note (Signed)
This note was copied from a baby's chart. Lactation Consultation Note  Patient Name: Carrie Delacruz [id number] Date: 06/25/2021 Reason for consult: Follow-up assessment;NICU baby;Late-preterm 34-36.6wks Age:30 hours  1215 - Interpretor: Silvia. Ms. Phoebe Perch was in room in NICU with baby Carrie Delacruz. She reports that [facility] contacted her and that they recommended picking up the pump tomorrow. However, she may be discharged today. I recommended that she call them to obtain the pump today, and she states that she does have their phone number. I made her aware of her manual pump in her kit.  Ms. Phoebe Perch is pumping infrequently due to perceived lack of milk. I educated on milk production and what to expect in the first 2-3 days of pumping, and tried to reassure her this was normal. I recommended that she pump every 2-3 hours during the day and an average of 8 pumping sessions in a 24 hour period.   Maternal Data Does the patient have breastfeeding experience prior to this delivery?: Yes How long did the patient breastfeed?: 2 years  Feeding Mother's Current Feeding Choice: Breast Milk and Formula   Lactation Tools Discussed/Used Breast pump type: Double-Electric Breast Pump;Other (comment) (Working with [facility]) Pump Education: Setup, frequency, and cleaning Reason for Pumping: NICU Pumping frequency: 1 time/day; recommended every three hours Pumped volume: 0 mL  Interventions Interventions: Breast feeding basics reviewed;Education  Discharge Pump:  (Working on picking up a WIC pump tomorrow (recommended she try to get it today) WIC Program: Yes  Consult Status Consult Status: Follow-up Follow-up type: In-patient    Lenore Manner 06/25/2021, 12:38 PM

## 2021-06-26 ENCOUNTER — Ambulatory Visit: Payer: Self-pay

## 2021-06-26 NOTE — Lactation Note (Addendum)
This note was copied from a baby's chart. Lactation Consultation Note Mother has +breast changes today without s/s of engorgement on day 3. She is pumping infrequently; I encouraged her to increase pumping frequency to q3. I will plan return visit at 3pm to assist with bf attempt.  Addendum @1500 : LC returned to assist with bf. This experienced bf'ing mother declined assistance. She felt comfortable positioning baby. LC observed several brief latches. I explained that the expectation for bf'ing today was low. Baby exceeded my expectations. Mother is aware of LC support services.   POC: Pump q 3. Continue lick and learn activities at feeding times.   Patient Name: Carrie Delacruz Ermalene Postin Date: 06/26/2021 Reason for consult: Follow-up assessment Age:30 hours  Maternal Data +breast changes today Engorgement and breast care education Pump: [facility] Loaner No pumping since last night  Feeding Mother's Current Feeding Choice: Breast Milk and Donor Milk  Interventions Interventions: [facility]  Consult Status Consult Status: Follow-up Follow-up type: In-patient 3pm feeding observation 8/12   10/12, Elder Negus IBCLC 06/26/2021, 2:34 PM

## 2021-06-28 ENCOUNTER — Ambulatory Visit: Payer: Self-pay

## 2021-06-28 NOTE — Lactation Note (Signed)
This note was copied from a baby's chart. Lactation Consultation Note Mother with + breast changes. Breasts are full but not engorged today. Encouraged mother to continue frequent pumping. Ice packs may be used between pumpings prn.   Patient Name: Boy Erie Sica [id number] Date: 06/28/2021 Reason for consult: Follow-up assessment Age:30 days  Maternal Data  Pumping frequency: q3 Pumped volume: 30 mL  Feeding Mother's Current Feeding Choice: Breast Milk and Donor Milk  Interventions Interventions: Education  Discharge Discharge Education: Engorgement and breast care Pump: [facility] Loaner  Consult Status Consult Status: Follow-up Follow-up type: In-patient   Elder Negus, MA IBCLC 06/28/2021, 5:24 PM

## 2021-06-30 ENCOUNTER — Ambulatory Visit: Payer: Self-pay

## 2021-06-30 NOTE — Lactation Note (Signed)
This note was copied from a baby's chart. Lactation Consultation Note  Patient Name: Carrie Delacruz [id number] Date: 06/30/2021 Reason for consult: Follow-up assessment;NICU baby;Infant < 6lbs;Late-preterm 34-36.6wks Age:30 days  Visited with mom of 82 days old LPI female, she's a P2 but her first baby is now 49 years old. NICU RN paged [facility] because mom was getting ready to latch baby to breast.  Baby already nursing upon entering, but latch was shallow and mom had poor positioning. Re-aligned mom and baby and showed her how to do hand expression without "letting the skin go" to get some droplets prior latching; she voiced understanding and did teach back to [facility].  Baby fed for a total of 20 minutes with a few loud audible swallows noted, mom's milk is in but she's not pumping quite consistently yet. Explained the importance of consistent pumping to protect her supply.  Plan of care:  Encouraged mom to start pumping consistently every 2-3 hours, at least 8 times/24 hours  She'll continue taking baby to breast on feeding cues and call NICU LC PRN  No other support person at this time. This was in mom's preferred language, Spanish; all questions and concerns answered.  Maternal Data   Mom's supply is WNL  Feeding Mother's Current Feeding Choice: Breast Milk and Donor Milk Nipple Type: Dr. Levert Feinstein Preemie  LATCH Score Latch: Repeated attempts needed to sustain latch, nipple held in mouth throughout feeding, stimulation needed to elicit sucking reflex. (latched easily but he started falling asleep at 12 minutes mark and required constant stimulation)  Audible Swallowing: A few with stimulation (with breast compressions)  Type of Nipple: Everted at rest and after stimulation  Comfort (Breast/Nipple): Soft / non-tender  Hold (Positioning): Assistance needed to correctly position infant at breast and maintain latch.  LATCH Score: 7   Lactation Tools Discussed/Used Tools:  Pump Breast pump type: Double-Electric Breast Pump Pump Education: Setup, frequency, and cleaning;Milk Storage Reason for Pumping: LPI in NICU Pumping frequency: 6-7 times/day Pumped volume: 50 mL (60)  Interventions Interventions: Breast feeding basics reviewed;DEBP;Breast massage;Hand express;Breast compression;Adjust position;Support pillows;Education  Discharge Pump: DEBP  Consult Status Consult Status: Follow-up Follow-up type: In-patient    Lewin Pellow Venetia Constable 06/30/2021, 3:52 PM

## 2021-07-01 ENCOUNTER — Encounter: Payer: Self-pay | Admitting: Family Medicine

## 2021-07-02 ENCOUNTER — Other Ambulatory Visit: Payer: Self-pay

## 2021-07-02 ENCOUNTER — Encounter: Payer: Self-pay | Admitting: Obstetrics and Gynecology

## 2021-07-06 ENCOUNTER — Ambulatory Visit: Payer: Self-pay

## 2021-07-06 ENCOUNTER — Telehealth (HOSPITAL_COMMUNITY): Payer: Self-pay | Admitting: *Deleted

## 2021-07-06 NOTE — Telephone Encounter (Signed)
Interpreter left voice message to return nurse call if any concerns about mom or baby.   Duffy Rhody, RN 07-06-2021 at 2:59pm

## 2021-07-07 ENCOUNTER — Ambulatory Visit: Payer: Self-pay

## 2021-07-07 NOTE — Lactation Note (Signed)
This note was copied from a baby's chart. Lactation Consultation Note  Patient Name: Carrie Delacruz [id number] Date: 07/07/2021 Reason for consult: Follow-up assessment;NICU baby;Late-preterm 34-36.6wks Age:30 wk.o.  Visited with mo of 25 85/31 weeks old (adjusted) NICU female for a feeding assist, baby was being fed by a bottle when entered the room (by RN) since mom wasn't here for the 12 pm feeding yet.  LC assisted mom and baby to get ready, baby latched with ease but he was unable to sustain the latch and kept slipping off the bare breast. LC tried a NS # 24 and baby did much better, he sustained the latch through the entire feeding and engaged in both, NS and NNS feeding patterns; switching back and forth.   Baby "Carrie Delacruz" got tired after 5 minutes and LC transitioned back to the bottle, he was still awake and alert, LC showed mom how to burp baby per her request, mom aware to call NICU LC for assistance as needed.  Plan of care:   Encouraged mom to try to pump consistently; ideally 8 times/24 hours  She'll power pump in the AM if she misses her pumping session at night She'll continue taking baby to breast on feeding cues and call NICU LC PRN   No other support person at this time. This consult was in mom's preferred language, Spanish; all questions and concerns answered.  Maternal Data   Mother's milk supply is BNL, she's still missing her pumping session at night  Feeding Mother's Current Feeding Choice: Breast Milk and Donor Milk Nipple Type: Dr. Levert Feinstein Preemie  LATCH Score Latch: Repeated attempts needed to sustain latch, nipple held in mouth throughout feeding, stimulation needed to elicit sucking reflex. (with NS # 24)  Audible Swallowing: A few with stimulation  Type of Nipple: Everted at rest and after stimulation  Comfort (Breast/Nipple): Soft / non-tender  Hold (Positioning): Assistance needed to correctly position infant at breast and maintain  latch.  LATCH Score: 7   Lactation Tools Discussed/Used Tools: Pump;Nipple Shields Nipple shield size: 24 Breast pump type: Double-Electric Breast Pump Pump Education: Setup, frequency, and cleaning;Milk Storage Reason for Pumping: LPI in NICU Pumping frequency: q 3-4 hours Pumped volume: 60 mL  Interventions Interventions: Breast feeding basics reviewed;Assisted with latch;Breast massage;Hand express;Breast compression;Adjust position;Support pillows;DEBP  Discharge Pump: DEBP  Consult Status Consult Status: Follow-up Follow-up type: In-patient    Nashea Chumney Venetia Constable 07/07/2021, 12:46 PM

## 2021-07-10 ENCOUNTER — Ambulatory Visit: Payer: Self-pay

## 2021-07-10 NOTE — Lactation Note (Signed)
This note was copied from a baby's chart. Lactation Consultation Note  Patient Name: Carrie Delacruz [id number] Date: 07/10/2021 Reason for consult: Follow-up assessment;Late-preterm 34-36.6wks;NICU baby Age:30 wk.o.  Visited with mom of 48 weeks old LPI NICU female, she missed her 12 pm appt for feeding assist, but came for the 3 pm feeding where LC assisted with latch. Mom reported she hasn't been latching baby to breast neither using the NS # 24 that was given on the last LC appt.  Mom tried at the bare breast first but baby was fussy and did not latch. Then LC tried the NS # 24 but baby still wouldn't latch. Finally, LC assisted mom with hand expression and attempt to latch to naked breast after doing some suck training and baby finally latched, with considerable depth.  He fed for a total of 25 minutes, with multiple audible swallows starting at the 3-5 minutes mark. Baby kept taking breaks though to keep up with mother's MER. He made some minor grunting noises a few times and finally self release from the breast when he fell asleep.  Reviewed feedings expectations, LPI behavior and baby's maturity to start taking oral feeds.   Plan of care:   Encouraged mom to try to pump consistently; ideally 8 times/24 hours  She'll power pump in the AM if she misses her pumping session at night She'll start taking baby to breast on feeding cues and call NICU LC PRN   No other support person at this time. This consult was in mom's preferred language, Spanish; all questions and concerns answered.  Maternal Data   Mom's milk supply is BNL; despite of her having the perception that is pumping "copious amounts".  Feeding Mother's Current Feeding Choice: Breast Milk Nipple Type: Dr. Levert Feinstein Preemie  LATCH Score Latch: Repeated attempts needed to sustain latch, nipple held in mouth throughout feeding, stimulation needed to elicit sucking reflex. (with and without NS # 24, however most of  the feeding was without the NS, baby kept spitting it out)  Audible Swallowing: Spontaneous and intermittent  Type of Nipple: Everted at rest and after stimulation  Comfort (Breast/Nipple): Soft / non-tender  Hold (Positioning): Assistance needed to correctly position infant at breast and maintain latch.  LATCH Score: 8   Lactation Tools Discussed/Used Tools: Pump;Nipple Shields Nipple shield size: 24 Breast pump type: Double-Electric Breast Pump Pump Education: Setup, frequency, and cleaning;Milk Storage Reason for Pumping: LPI in NICU Pumping frequency: 7 times/24 hours Pumped volume: 75 mL  Interventions Interventions: Breast feeding basics reviewed;Assisted with latch;Skin to skin;Breast massage;Hand express;Support pillows;Adjust position;Breast compression;DEBP;Education  Discharge Pump: DEBP  Consult Status Consult Status: Follow-up Follow-up type: In-patient    Carrie Delacruz 07/10/2021, 3:25 PM

## 2021-07-11 ENCOUNTER — Inpatient Hospital Stay (HOSPITAL_COMMUNITY): Payer: Self-pay

## 2021-07-11 ENCOUNTER — Inpatient Hospital Stay (HOSPITAL_COMMUNITY): Admission: AD | Admit: 2021-07-11 | Payer: Self-pay | Source: Home / Self Care | Admitting: Obstetrics & Gynecology

## 2021-07-12 ENCOUNTER — Ambulatory Visit: Payer: Self-pay

## 2021-07-12 NOTE — Lactation Note (Signed)
This note was copied from a baby's chart. Lactation Consultation Note  Patient Name: Boy Kiki Bivens [id number] Date: 07/12/2021 Reason for consult: Follow-up assessment;NICU baby;Early term 37-38.6wks Age:31 wk.o.  Visited with mom of 37 1/7 weeks (adjusted) NICU female, baby is going home today. Reviewed discharge education, pumping schedule, BF basics and double feeding from the breast/bottle. Outpatient recommendation to [facility]. was also discussed.  Mom plans on putting baby to breast at home, but she's aware that she still needs to pump after feedings to protect her supply. All questions and concerns answered, parents to call NICU LC whenever is needed, they got a new brochure with our phone #.  Maternal Data   Mom's supply is BNL  Feeding Mother's Current Feeding Choice: Breast Milk Nipple Type: Dr. Levert Feinstein Preemie  Lactation Tools Discussed/Used Tools: Pump;Nipple Dorris Carnes Nipple shield size: 24 Breast pump type: Double-Electric Breast Pump Pump Education: Setup, frequency, and cleaning;Milk Storage Reason for Pumping: ETI in NICU Pumping frequency: 7 times/24 hours Pumped volume: 75 mL  Interventions Interventions: Breast feeding basics reviewed;DEBP;Education  Discharge Discharge Education: Outpatient recommendation Pump: DEBP  Consult Status Consult Status: Complete Date: 07/12/21 Follow-up type: Call as needed    Ricardo Kayes Venetia Constable 07/12/2021, 5:02 PM

## 2021-08-04 ENCOUNTER — Ambulatory Visit: Payer: Self-pay

## 2021-08-06 ENCOUNTER — Other Ambulatory Visit: Payer: Self-pay

## 2021-08-06 DIAGNOSIS — O24415 Gestational diabetes mellitus in pregnancy, controlled by oral hypoglycemic drugs: Secondary | ICD-10-CM

## 2021-08-07 ENCOUNTER — Other Ambulatory Visit: Payer: Self-pay

## 2021-08-07 ENCOUNTER — Encounter: Payer: Self-pay | Admitting: Certified Nurse Midwife

## 2021-08-07 ENCOUNTER — Ambulatory Visit (INDEPENDENT_AMBULATORY_CARE_PROVIDER_SITE_OTHER): Payer: Self-pay | Admitting: Certified Nurse Midwife

## 2021-08-07 DIAGNOSIS — O24415 Gestational diabetes mellitus in pregnancy, controlled by oral hypoglycemic drugs: Secondary | ICD-10-CM

## 2021-08-07 DIAGNOSIS — Z9189 Other specified personal risk factors, not elsewhere classified: Secondary | ICD-10-CM

## 2021-08-07 DIAGNOSIS — Z975 Presence of (intrauterine) contraceptive device: Secondary | ICD-10-CM

## 2021-08-07 NOTE — Progress Notes (Signed)
Lactation Note  Spanish interpreter (Raquel) present throughout visit for interpretation services  S: During her postpartum visit, pt expressed several concerns about baby's feeding patterns. Says he cues and feeds well but pulls off to choke when her milk lets down and often chokes on the bottle. Pediatricians have all attributed this to his age and recommended decreasing the nipple flow on the bottles she is using. He is feeding q2-3hrs at the breast with supplementation of formula by bottle. Voids well, has been struggling with constipation which got better when she switched formula brands. Denies baby being assessed for lip or tongue ties. Denies nipple pain with latching, says it feels like tugging, not pinching or biting.  O: Baby asleep but tolerated oral exam well and able to suck on my gloved finger. Did bite a lot initially but got into a consistent sucking pattern with appropriate undulation and tongue movement. Palate is not high, but loose lip tie noted with good lip mobility as well as thin but tight anterior tongue tie noted. Good mobility of tongue.  A: Interpreter pulled up pictures of tongue ties and explained more in depth while I got the breast pump. Affirmed her explanation and demonstrated stretches under baby's tongue to facilitate increased mobility of tongue and appropriate latch that will prevent choking. Showed mom how to hold baby/bottle for side-lying paced bottle feeding to prevent choking and affirmed advice to switch to slower flow. Gave Medela hand pump and advised to pump for prior to latching to get him past the initial heavy letdown. Can mix with formula for supplementation after feeding until he is able to take entire feeds at the breast. Reviewed use of hand pump and storage guidelines for expressed milk. Pt expressed understanding and relief at having a plan.  P: Advised she discuss tongue tie with pediatrician if 2wks of daily mouth stretches and new feeding  positions/techniques do not help baby's feeding difficulties. Can follow up with OP lactation PRN.  Edd Arbour, CNM, MSN, IBCLC Certified Nurse Midwife, [facility]

## 2021-08-07 NOTE — Progress Notes (Signed)
Post Partum Visit Note  Carrie Delacruz is a 30 y.o. [phone] female who presents for a postpartum visit. She is 6 weeks postpartum following a normal spontaneous vaginal delivery.  I have fully reviewed the prenatal and intrapartum course. The delivery was at [redacted]w[redacted]d gestational weeks.  Anesthesia: epidural. Postpartum course has been overall good, complicated by baby's NICU stay and feeding difficulties (see additional lactation note). Baby is doing well. Baby is feeding by both breast and bottle - Enfamil infant care . Bleeding no bleeding. Bowel function is normal. Bladder function is normal. Patient is not sexually active. Contraception method is Nexplanon. Postpartum depression screening: negative.  The pregnancy intention screening data noted above was reviewed. Potential methods of contraception were discussed. The patient elected to proceed with No data recorded.   Edinburgh Postnatal Depression Scale - 08/07/21 0932       Edinburgh Postnatal Depression Scale:  In the Past 7 Days   I have been able to laugh and see the funny side of things. 0    I have looked forward with enjoyment to things. 0    I have blamed myself unnecessarily when things went wrong. 0    I have been anxious or worried for no good reason. 0    I have felt scared or panicky for no good reason. 0    Things have been getting on top of me. 0    I have been so unhappy that I have had difficulty sleeping. 0    I have felt sad or miserable. 0    I have been so unhappy that I have been crying. 0    The thought of harming myself has occurred to me. 0    Edinburgh Postnatal Depression Scale Total 0             Health Maintenance Due  Topic Date Due   PAP-Cervical Cytology Screening  Never done   PAP SMEAR-Modifier  Never done   INFLUENZA VACCINE  06/15/2021    The following portions of the patient's history were reviewed and updated as appropriate: allergies, current medications, past family  history, past medical history, past social history, past surgical history, and problem list.  Review of Systems Pertinent items noted in HPI and remainder of comprehensive ROS otherwise negative.  Objective:  BP 116/88   Pulse 73   Ht 4\' 10"  (1.473 m)   Wt 134 lb (60.8 kg)   LMP 11/18/2020 (Approximate)   Breastfeeding Yes   BMI 28.01 kg/m    General:  alert, cooperative, appears stated age, and no distress   Breasts:  normal  Lungs: Normal effort  Heart:  regular rate and rhythm  Abdomen: Soft and nontender    Wound N/A  GU exam:  not indicated       Assessment & Plan:  Postpartum exam of lactating mother  Essential components of care per ACOG recommendations:  1.  Mood and well being: Patient with negative depression screening today. Reviewed local resources for support.  - Patient tobacco use? No.   - hx of drug use? No.    2. Infant care and feeding:  -Patient currently breastmilk feeding? Yes. Reviewed importance of draining breast regularly to support lactation.  -Social determinants of health (SDOH) reviewed in EPIC. No concerns  3. Sexuality, contraception and birth spacing - Patient does not want a pregnancy in the next year.  Desired family size is 2 children.  - Reviewed forms of contraception in tiered  fashion. Patient had a Nexplanon placed prior to hospital discharge. - Discussed birth spacing of 18 months  4. Sleep and fatigue -Encouraged family/partner/community support of 4 hrs of uninterrupted sleep to help with mood and fatigue  5. Physical Recovery  - Discussed patients delivery and complications. She describes her labor as mixed. - Patient had a Vaginal, no problems at delivery. Patient had a  labial  laceration. Perineal healing reviewed. Patient expressed understanding - Patient has urinary incontinence? No. - Patient is safe to resume physical and sexual activity  6.  Health Maintenance - HM due items addressed Yes - Pt needs a pap smear but  currently does not have a Medicaid card. Pap smear not done at today's visit, but will refer pt to [facility] (message sent). -Breast Cancer screening indicated? No.   7. Chronic Disease/Pregnancy Condition follow up: Gestational Diabetes - 2hr completed today, will refer as needed - PCP follow up  Bernerd Limbo, CNM [facility] [employer], [facility]

## 2021-08-08 LAB — GLUCOSE TOLERANCE, 2 HOURS
Glucose, 2 hour: 141 mg/dL — ABNORMAL HIGH (ref 65–139)
Glucose, GTT - Fasting: 80 mg/dL (ref 65–99)

## 2021-08-10 ENCOUNTER — Other Ambulatory Visit: Payer: Self-pay | Admitting: Certified Nurse Midwife

## 2021-08-10 ENCOUNTER — Telehealth: Payer: Self-pay

## 2021-08-10 DIAGNOSIS — E119 Type 2 diabetes mellitus without complications: Secondary | ICD-10-CM

## 2021-08-10 NOTE — Telephone Encounter (Signed)
Call placed to pt. No answer, will retry pt later today with Raquel interpreter.  Judeth Cornfield, RN

## 2021-08-10 NOTE — Telephone Encounter (Signed)
Returned call to pt. Spoke with pt with Raquel interpreting for pt. Pt given results and recommendations per Madalyn Rob, CNM. Pt verbalized understanding and agreeable to plan of care.  Judeth Cornfield, RN

## 2021-08-10 NOTE — Telephone Encounter (Signed)
-----   Message from Carrie Delacruz, [place] sent at 08/10/2021 10:06 AM EDT ----- Can someone call the patient with the interpreter to give her results - she has diabetes and I have put in a referral to family medicine for initial management.

## 2021-09-22 ENCOUNTER — Other Ambulatory Visit: Payer: Self-pay

## 2021-09-22 ENCOUNTER — Ambulatory Visit: Payer: Self-pay | Attending: Family Medicine | Admitting: Family Medicine

## 2021-09-22 VITALS — BP 123/84 | HR 61 | Ht 58.5 in | Wt 132.4 lb

## 2021-09-22 DIAGNOSIS — Z23 Encounter for immunization: Secondary | ICD-10-CM

## 2021-09-22 DIAGNOSIS — Z8632 Personal history of gestational diabetes: Secondary | ICD-10-CM

## 2021-09-22 DIAGNOSIS — Z975 Presence of (intrauterine) contraceptive device: Secondary | ICD-10-CM

## 2021-09-22 DIAGNOSIS — N921 Excessive and frequent menstruation with irregular cycle: Secondary | ICD-10-CM

## 2021-09-22 LAB — POCT GLYCOSYLATED HEMOGLOBIN (HGB A1C): Hemoglobin A1C: 5.6 % (ref 4.0–5.6)

## 2021-09-22 NOTE — Patient Instructions (Signed)
Plan de alimentacin para personas con prediabetes Prediabetes Eating Plan La prediabetes es una afeccin que hace que los niveles de azcar en la sangre (glucosa) sean ms altos de lo normal. Esto aumenta el riesgo de desarrollar diabetes tipo 2 (diabetes mellitus tipo 2). Trabajar con un profesional de la salud o especialista en nutricin (nutricionista) para hacer cambios en la dieta y el estilo de vida puede ayudar a prevenir el inicio de la diabetes. Estos cambios pueden ayudarlo a: Controlar los niveles de glucemia. Mejorar los niveles de colesterol. Controlar la presin arterial. Consejos para seguir este plan Al leer las etiquetas de los alimentos Lea las etiquetas de los alimentos envasados para controlar la cantidad de grasa, sal (sodio) y azcar que contienen. Evite los alimentos que contengan lo siguiente: Grasas saturadas. Grasas trans. Azcares agregados. Evite los alimentos que contengan ms de 300 miligramos (mg) de sodio por porcin. Limite el consumo de sodio a menos de 2300 mg por da. Al ir de compras Evite comprar alimentos procesados y preelaborados. Evite comprar bebidas con azcar agregada. Al cocinar Cocine con aceite de oliva. No use mantequilla, manteca de cerdo ni mantequilla clarificada. Cocine los alimentos al horno, a la parrilla, asados, al vapor o hervidos. Evite frerlos. Planificacin de las comidas  Trabaje con el nutricionista para crear un plan de alimentacin que sea adecuado para usted. Esto puede incluir el seguimiento de cuntas caloras ingiere al da. Use un registro de alimentos, un cuaderno o una aplicacin mvil para anotar lo que comi en cada comida. Considere la posibilidad de seguir una dieta mediterrnea. Esta puede comprender lo siguiente: Comer varias porciones de frutas y verduras frescas por da. Pescado al menos dos veces por semana. Comer una porcin de cereales integrales, frijoles, frutos secos y semillas por da. Aceite de oliva  en lugar de otras grasas. Limitar el consumo de alcohol. Limitar la carne roja. Usar productos lcteos descremados o con bajo contenido de grasa. Considerar seguir una dieta a base de vegetales. Esta incluye hacer elecciones alimentarias que se concentren en comer principalmente verduras y frutas, cereales, frijoles, frutos secos y semillas. Si tiene hipertensin arterial, quizs deba limitar el consumo de sodio o seguir una dieta como el plan de alimentacin basado en los Enfoques Alimentarios para Detener la Hipertensin (Dietary Approaches to Stop Hypertension, DASH). La dieta DASH tiene como objetivo bajar la hipertensin arterial.  Estilo de vida Establezca metas para bajar de peso con la ayuda de su equipo de atencin mdica. A la mayora de las personas con prediabetes se les recomienda bajar un 7 % de su peso corporal. Haga al menos 30 minutos de ejercicio, 5 o ms das a la semana. Asista a un grupo de apoyo o solicite el apoyo de un consejero de salud mental. Use los medicamentos de venta libre y los recetados solamente como se lo haya indicado el mdico. Qu alimentos se recomiendan? Frutas Bayas. Bananas. Manzanas. Naranjas. Uvas. Papaya. Mango. Granada. Kiwi. Pomelo.Cerezas. Verduras Lechuga. Espinaca. Guisantes. Remolachas. Coliflor. Repollo. Brcoli. Zanahorias. Tomates. Calabaza. Berenjena. Hierbas. Pimientos. Cebollas.Pepinos. Coles de Bruselas. Granos Productos integrales, como panes, galletas, cereales y pastas de salvado o integrales. Avena sin azcar. Trigo burgol. Cebada. Quinua. Arroz integral.Tacos o tortillas de harina de maz o de salvado. Carnes y otras protenas Mariscos. Carne de ave sin piel. Cortes magros de cerdo y carne de res. Tofu.Huevos. Frutos secos. Frijoles. Lcteos Productos lcteos descremados o semidescremados, como yogur, queso cottage yqueso. Bebidas Agua. T. Caf. Gaseosas sin azcar o dietticas. Soda. Leche   descremada o con bajo contenido de  grasa. Productos alternativos a la leche, como leche de sojao de almendras. Grasas y aceites Aceite de oliva. Aceite de canola. Aceite de girasol. Aceite de semillas deuva. Aguacate. Nueces. Dulces y postres Pudin sin azcar o con bajo contenido de grasa. Helado y otros postrescongelados sin azcar o con bajo contenido de grasa. Alios y condimentos Hierbas. Especias sin sodio. Mostaza. Salsa de pepinillos. Ktchup con bajo contenido de sal y de azcar. Salsa barbacoa con bajo contenido de sal y deazcar. Mayonesa con bajo contenido de grasa o sin grasa. Es posible que los productos mencionados arriba no formen una lista completa de las bebidas o los alimentos recomendados. Consulte a un nutricionista para obtener ms informacin. Qu alimentos no se recomiendan? Frutas Frutas enlatadas al almbar. Verduras Verduras enlatadas. Verduras congeladas con mantequilla o salsa de crema. Granos Productos elaborados con harina y harina blanca refinada, como panes, pastas,bocadillos y cereales. Carnes y otras protenas Cortes de carne con alto contenido de grasa. Carne de ave con piel. Carneempanizada o frita. Carnes procesadas. Lcteos Yogur, queso o leche enteros. Bebidas Bebidas azucaradas, como t helado y gaseosas. Grasas y aceites Mantequilla. Manteca de cerdo. Mantequilla clarificada. Dulces y postres Productos horneados, como pasteles, pastelitos, galletas dulces y tarta dequeso. Alios y condimentos Mezclas de especias con sal agregada. Ktchup. Salsa barbacoa. Mayonesa. Es posible que los productos que se enumeran ms arriba no sean una lista completa de los alimentos y las bebidas que no se recomiendan. Consulte a un nutricionista para obtener ms informacin. Dnde buscar ms informacin American Diabetes Association (Asociacin Estadounidense de la Diabetes): www.diabetes.org Resumen Es posible que deba hacer cambios en la dieta y el estilo de vida para ayudar a prevenir el inicio  de la diabetes. Estos cambios pueden ayudarlo a controlar el azcar en la sangre, mejorar los niveles de colesterol y controlar la presin arterial. Establezca metas para bajar de peso con la ayuda de su equipo de atencin mdica. A la mayora de las personas con prediabetes se les recomienda bajar un 7 % de su peso corporal. Considere la posibilidad de seguir una dieta mediterrnea. Esto incluye comer muchas frutas y verduras frescas, cereales integrales, frijoles, frutos secos, semillas, pescado y productos lcteos con bajo contenido de grasa, y usar aceite de oliva en lugar de otras grasas. Esta informacin no tiene como fin reemplazar el consejo del mdico. Asegresede hacerle al mdico cualquier pregunta que tenga. Document Revised: 04/29/2020 Document Reviewed: 04/29/2020 Elsevier Patient Education  2022 Elsevier Inc.  

## 2021-09-22 NOTE — Progress Notes (Signed)
Has nexplanon placed on 06/25/21 and has had 2 cycles since then  History of DM while pregnant.

## 2021-09-22 NOTE — Progress Notes (Signed)
Subjective:  Patient ID: Carrie Delacruz, female    DOB: Oct 21, 1991  Age: 30 y.o. MRN: [id number]  CC: New Patient (Initial Visit)   HPI Carrie Delacruz is a 30 y.o. year old female, 3 months postpartum with a history of Gestation Diabetes here to establish care. Seen with the aid of an in person Spanish interpreter.  Interval History: She had Nexplanon placed in 06/2021 and has had 2 menstrual cycles since them but with the last cycle she has been spotting for one month.  Denies presence of cramping abdominal pain. A1c from today is 5.6 and she is currently not on any medications. She has no additional concerns today. Past Medical History:  Diagnosis Date   Cholestasis    Gestational diabetes mellitus (GDM) in third trimester 05/16/2021   Intrahepatic cholestasis of pregnancy, antepartum 05/17/2021   05/15/21 Bile acids 16.6, nml LFTs >>Ursodiol prescribed.   Metrorrhagia    Pruritus of pregnancy in third trimester 05/15/2021    Past Surgical History:  Procedure Laterality Date   NO PAST SURGERIES      Family History  Problem Relation Age of Onset   Diabetes Mother    Asthma Mother     No Known Allergies  Outpatient Medications Prior to Visit  Medication Sig Dispense Refill   acetaminophen (TYLENOL) 500 MG tablet Take 2 tablets (1,000 mg total) by mouth every 6 (six) hours as needed for mild pain. (Patient not taking: Reported on 09/22/2021) 60 tablet 2   ibuprofen (ADVIL) 600 MG tablet Take 1 tablet (600 mg total) by mouth every 6 (six) hours as needed for moderate pain, headache or cramping. (Patient not taking: Reported on 09/22/2021) 30 tablet 2   Prenatal Vit-Fe Fumarate-FA (PRENATAL MULTIVITAMIN) TABS tablet Take 1 tablet by mouth daily at 12 noon. (Patient not taking: Reported on 09/22/2021)     senna-docusate (SENOKOT-S) 8.6-50 MG tablet Take 2 tablets by mouth at bedtime as needed for moderate constipation. (Patient not taking: Reported on  09/22/2021) 30 tablet 2   No facility-administered medications prior to visit.     ROS Review of Systems  Constitutional:  Negative for activity change, appetite change and fatigue.  HENT:  Negative for congestion, sinus pressure and sore throat.   Eyes:  Negative for visual disturbance.  Respiratory:  Negative for cough, chest tightness, shortness of breath and wheezing.   Cardiovascular:  Negative for chest pain and palpitations.  Gastrointestinal:  Negative for abdominal distention, abdominal pain and constipation.  Endocrine: Negative for polydipsia.  Genitourinary:  Positive for menstrual problem. Negative for dysuria and frequency.  Musculoskeletal:  Negative for arthralgias and back pain.  Skin:  Negative for rash.  Neurological:  Negative for tremors, light-headedness and numbness.  Hematological:  Does not bruise/bleed easily.  Psychiatric/Behavioral:  Negative for agitation and behavioral problems.    Objective:  BP 123/84   Pulse 61   Ht 4' 10.5" (1.486 m)   Wt 132 lb 6.4 oz (60.1 kg)   SpO2 98%   BMI 27.20 kg/m   BP/Weight 09/22/2021 08/07/2021 06/25/2021  Systolic BP 123 116 116  Diastolic BP 84 88 73  Wt. (Lbs) 132.4 134 -  BMI 27.2 28.01 -      Physical Exam Constitutional:      Appearance: She is well-developed.  Cardiovascular:     Rate and Rhythm: Normal rate.     Heart sounds: Normal heart sounds. No murmur heard. Pulmonary:     Effort: Pulmonary effort is  normal.     Breath sounds: Normal breath sounds. No wheezing or rales.  Chest:     Chest wall: No tenderness.  Abdominal:     General: Bowel sounds are normal. There is no distension.     Palpations: Abdomen is soft. There is no mass.     Tenderness: There is no abdominal tenderness.  Musculoskeletal:        General: Normal range of motion.     Right lower leg: No edema.     Left lower leg: No edema.  Neurological:     Mental Status: She is alert and oriented to person, place, and time.   Psychiatric:        Mood and Affect: Mood normal.    CMP Latest Ref Rng & Units 06/24/2021 06/23/2021 05/15/2021  Glucose 70 - 99 mg/dL 94 105(H) 223(H)  BUN 6 - 20 mg/dL - 11 8  Creatinine 0.44 - 1.00 mg/dL - 0.70 0.49(L)  Sodium 135 - 145 mmol/L - 135 135  Potassium 3.5 - 5.1 mmol/L - 3.6 3.8  Chloride 98 - 111 mmol/L - 104 98  CO2 22 - 32 mmol/L - 20(L) 19(L)  Calcium 8.9 - 10.3 mg/dL - 9.4 9.1  Total Protein 6.5 - 8.1 g/dL - 7.2 6.2  Total Bilirubin 0.3 - 1.2 mg/dL - 0.2(L) 0.2  Alkaline Phos 38 - 126 U/L - 190(H) 175(H)  AST 15 - 41 U/L - 30 30  ALT 0 - 44 U/L - 26 24    Lipid Panel     Component Value Date/Time   CHOL 148 03/28/2018 1627   TRIG 67 03/28/2018 1627   HDL 57 03/28/2018 1627   CHOLHDL 2.6 03/28/2018 1627   LDLCALC 78 03/28/2018 1627    CBC    Component Value Date/Time   WBC 8.8 06/23/2021 0038   RBC 4.66 06/23/2021 0038   HGB 13.6 06/23/2021 0038   HGB 12.9 05/15/2021 0857   HCT 40.9 06/23/2021 0038   HCT 38.4 05/15/2021 0857   PLT 307 06/23/2021 0038   PLT 303 05/15/2021 0857   MCV 87.8 06/23/2021 0038   MCV 89 05/15/2021 0857   MCH 29.2 06/23/2021 0038   MCHC 33.3 06/23/2021 0038   RDW 13.6 06/23/2021 0038   RDW 12.9 05/15/2021 0857   LYMPHSABS 1.9 01/28/2021 0946   EOSABS 0.1 01/28/2021 0946   BASOSABS 0.0 01/28/2021 0946    Lab Results  Component Value Date   HGBA1C 5.6 09/22/2021    Assessment & Plan:  1.History of gestational DM A1c of 5.6 No medications indicated at this time Continue with lifestyle modifications to prevent progression to type 2 diabetes mellitus - POCT glycosylated hemoglobin (Hb A1C)  2. Breakthrough bleeding on Nexplanon Patient reassured as this is not uncommon Hopefully over the next 3 to 6 months this should resolve  3. Need for immunization against influenza - Flu Vaccine QUAD 41mo+IM (Fluarix, Fluzone & Alfiuria Quad PF)    No orders of the defined types were placed in this  encounter.   Follow-up: Return in about 6 months (around 03/22/2022) for follow up of Prediabetes.       Charlott Rakes, MD, FAAFP. [facility] [place], [place]   09/22/2021, 9:19 AM

## 2021-12-02 ENCOUNTER — Other Ambulatory Visit: Payer: Self-pay | Admitting: *Deleted

## 2021-12-02 ENCOUNTER — Other Ambulatory Visit: Payer: Self-pay

## 2021-12-02 DIAGNOSIS — Z124 Encounter for screening for malignant neoplasm of cervix: Secondary | ICD-10-CM

## 2021-12-02 NOTE — Addendum Note (Signed)
Addended by: Narda Rutherford on: 12/02/2021 10:51 AM   Modules accepted: Orders

## 2021-12-02 NOTE — Progress Notes (Signed)
Patient: Carrie Delacruz           Date of Birth: [birth year]           MRN: [id number] Visit Date: 12/02/2021 PCP: Hoy Register, MD  Cervical Cancer Screening Do you smoke?: No Have you ever had or been told you have an allergy to latex products?: No Marital status: Married Date of last pap smear: 2-5 yrs ago (09/09/2017- negative ([facility])) Date of last menstrual period: 09/22/21 (currently breastfeeding) Number of pregnancies: 2 Number of births: 2 Have you ever had any of the following? Hysterectomy: No Tubal ligation (tubes tied): No Abnormal bleeding: No Abnormal pap smear: No Venereal warts: No A sex partner with venereal warts: No A high risk* sex partner: No  Cervical Exam  Abnormal Observations: Normal Exam. Recommendations: Last Pap smear was 09/09/2017 at the [facility] and normal. Per patient has no history of an abnormal Pap smear. Last Pap smear is available in Epic under care everywhere. Let patient know if today's Pap smear is normal and HPV negative that her next Pap smear is due in 5 years. Informed patient that will follow-up with her within the next couple of weeks with results of Pap smear by phone. Patient verbalized understanding.     Spanish interpreter Natale Lay from [facility] provided.  Patient's History Patient Active Problem List   Diagnosis Date Noted   Nexplanon in place 08/07/2021   History of cholestasis during pregnancy 01/28/2021   Past Medical History:  Diagnosis Date   Cholestasis    Gestational diabetes mellitus (GDM) in third trimester 05/16/2021   Intrahepatic cholestasis of pregnancy, antepartum 05/17/2021   05/15/21 Bile acids 16.6, nml LFTs >>Ursodiol prescribed.   Metrorrhagia    Pruritus of pregnancy in third trimester 05/15/2021    Family History  Problem Relation Age of Onset   Diabetes Mother    Asthma Mother     Social History   Occupational History   Not on file  Tobacco Use    Smoking status: Never   Smokeless tobacco: Never  Vaping Use   Vaping Use: Never used  Substance and Sexual Activity   Alcohol use: Never   Drug use: Never   Sexual activity: Yes    Birth control/protection: None    Comment: Depo Provera

## 2021-12-03 LAB — CYTOLOGY - PAP
Comment: NEGATIVE
Diagnosis: NEGATIVE
High risk HPV: NEGATIVE

## 2021-12-08 ENCOUNTER — Telehealth: Payer: Self-pay

## 2021-12-08 NOTE — Telephone Encounter (Signed)
Via Erika McReynolds, Spanish Interpreter ([employer]), Patient informed negative Pap/HPV results, next pap due in 5 years. Patient verbalized understanding.  

## 2022-03-22 ENCOUNTER — Ambulatory Visit: Payer: Self-pay | Admitting: Family Medicine

## 2022-03-24 ENCOUNTER — Ambulatory Visit: Payer: Self-pay | Attending: Family Medicine | Admitting: Family Medicine

## 2022-03-24 ENCOUNTER — Encounter: Payer: Self-pay | Admitting: Family Medicine

## 2022-03-24 VITALS — BP 124/88 | HR 91 | Wt 139.6 lb

## 2022-03-24 DIAGNOSIS — Z8632 Personal history of gestational diabetes: Secondary | ICD-10-CM | POA: Insufficient documentation

## 2022-03-24 DIAGNOSIS — Z13228 Encounter for screening for other metabolic disorders: Secondary | ICD-10-CM

## 2022-03-24 DIAGNOSIS — E663 Overweight: Secondary | ICD-10-CM

## 2022-03-24 LAB — POCT GLYCOSYLATED HEMOGLOBIN (HGB A1C): HbA1c, POC (prediabetic range): 5.6 % — AB (ref 5.7–6.4)

## 2022-03-24 NOTE — Progress Notes (Signed)
A1C  5.6

## 2022-03-24 NOTE — Progress Notes (Signed)
? ?Subjective:  ?Patient ID: Carrie Delacruz, female    DOB: 20-Dec-1990  Age: 31 y.o. MRN: [id number] ? ?CC: Gestational Diabetes ? ? ?HPI ?Carrie Delacruz is a 31 y.o. year old female with a history of Gestational diabetes (A1c 5.6) ? ?Interval History: ?Endorses decreasing intake of starches but she does not exercise. ?Since she got the Nexplanon she has been gaining weight more in her arms and abdomen. ?She has no dyspnea, chest, pain in her legs. ?Denies additional concerns today. ? ?Past Medical History:  ?Diagnosis Date  ? Cholestasis   ? Gestational diabetes mellitus (GDM) in third trimester 05/16/2021  ? Intrahepatic cholestasis of pregnancy, antepartum 05/17/2021  ? 05/15/21 Bile acids 16.6, nml LFTs >>Ursodiol prescribed.  ? Metrorrhagia   ? Pruritus of pregnancy in third trimester 05/15/2021  ? ? ?Past Surgical History:  ?Procedure Laterality Date  ? NO PAST SURGERIES    ? ? ?Family History  ?Problem Relation Age of Onset  ? Diabetes Mother   ? Asthma Mother   ? ? ?Social History  ? ?Socioeconomic History  ? Marital status: Single  ?  Spouse name: Not on file  ? Number of children: Not on file  ? Years of education: Not on file  ? Highest education level: Not on file  ?Occupational History  ? Not on file  ?Tobacco Use  ? Smoking status: Never  ? Smokeless tobacco: Never  ?Vaping Use  ? Vaping Use: Never used  ?Substance and Sexual Activity  ? Alcohol use: Never  ? Drug use: Never  ? Sexual activity: Yes  ?  Birth control/protection: None  ?  Comment: Depo Provera  ?Other Topics Concern  ? Not on file  ?Social History Narrative  ? Not on file  ? ?Social Determinants of Health  ? ?Financial Resource Strain: Not on file  ?Food Insecurity: Food Insecurity Present  ? Worried About Charity fundraiser in the Last Year: Sometimes true  ? Ran Out of Food in the Last Year: Sometimes true  ?Transportation Needs: No Transportation Needs  ? Lack of Transportation (Medical): No  ? Lack of  Transportation (Non-Medical): No  ?Physical Activity: Not on file  ?Stress: Not on file  ?Social Connections: Not on file  ? ? ?No Known Allergies ? ?Outpatient Medications Prior to Visit  ?Medication Sig Dispense Refill  ? acetaminophen (TYLENOL) 500 MG tablet Take 2 tablets (1,000 mg total) by mouth every 6 (six) hours as needed for mild pain. (Patient not taking: Reported on 09/22/2021) 60 tablet 2  ? ibuprofen (ADVIL) 600 MG tablet Take 1 tablet (600 mg total) by mouth every 6 (six) hours as needed for moderate pain, headache or cramping. (Patient not taking: Reported on 09/22/2021) 30 tablet 2  ? Prenatal Vit-Fe Fumarate-FA (PRENATAL MULTIVITAMIN) TABS tablet Take 1 tablet by mouth daily at 12 noon. (Patient not taking: Reported on 09/22/2021)    ? senna-docusate (SENOKOT-S) 8.6-50 MG tablet Take 2 tablets by mouth at bedtime as needed for moderate constipation. (Patient not taking: Reported on 09/22/2021) 30 tablet 2  ? ?No facility-administered medications prior to visit.  ? ? ? ?ROS ?Review of Systems  ?Constitutional:  Negative for activity change, appetite change and fatigue.  ?HENT:  Negative for congestion, sinus pressure and sore throat.   ?Eyes:  Negative for visual disturbance.  ?Respiratory:  Negative for cough, chest tightness, shortness of breath and wheezing.   ?Cardiovascular:  Negative for chest pain and palpitations.  ?Gastrointestinal:  Negative for abdominal distention, abdominal pain and constipation.  ?Endocrine: Negative for polydipsia.  ?Genitourinary:  Negative for dysuria and frequency.  ?Musculoskeletal:  Negative for arthralgias and back pain.  ?Skin:  Negative for rash.  ?Neurological:  Negative for tremors, light-headedness and numbness.  ?Hematological:  Does not bruise/bleed easily.  ?Psychiatric/Behavioral:  Negative for agitation and behavioral problems.   ? ?Objective:  ?BP 124/88   Pulse 91   Wt 139 lb 9.6 oz (63.3 kg)   SpO2 99%   BMI 28.68 kg/m?  ? ? ?  03/24/2022  ? 11:30 AM  09/22/2021  ?  8:58 AM 08/07/2021  ?  9:30 AM  ?BP/Weight  ?Systolic BP 202 542 706  ?Diastolic BP 88 84 88  ?Wt. (Lbs) 139.6 132.4 134  ?BMI 28.68 kg/m2 27.2 kg/m2 28.01 kg/m2  ? ? ? ? ?Physical Exam ?Constitutional:   ?   Appearance: She is well-developed.  ?Cardiovascular:  ?   Rate and Rhythm: Normal rate.  ?   Heart sounds: Normal heart sounds. No murmur heard. ?Pulmonary:  ?   Effort: Pulmonary effort is normal.  ?   Breath sounds: Normal breath sounds. No wheezing or rales.  ?Chest:  ?   Chest wall: No tenderness.  ?Abdominal:  ?   General: Bowel sounds are normal. There is no distension.  ?   Palpations: Abdomen is soft. There is no mass.  ?   Tenderness: There is no abdominal tenderness.  ?Musculoskeletal:     ?   General: Normal range of motion.  ?   Right lower leg: No edema.  ?   Left lower leg: No edema.  ?Neurological:  ?   Mental Status: She is alert and oriented to person, place, and time.  ?Psychiatric:     ?   Mood and Affect: Mood normal.  ? ? ? ?  Latest Ref Rng & Units 06/24/2021  ?  4:24 AM 06/23/2021  ? 12:38 AM 05/15/2021  ?  9:37 AM  ?CMP  ?Glucose 70 - 99 mg/dL 94   105   223    ?BUN 6 - 20 mg/dL  11   8    ?Creatinine 0.44 - 1.00 mg/dL  0.70   0.49    ?Sodium 135 - 145 mmol/L  135   135    ?Potassium 3.5 - 5.1 mmol/L  3.6   3.8    ?Chloride 98 - 111 mmol/L  104   98    ?CO2 22 - 32 mmol/L  20   19    ?Calcium 8.9 - 10.3 mg/dL  9.4   9.1    ?Total Protein 6.5 - 8.1 g/dL  7.2   6.2    ?Total Bilirubin 0.3 - 1.2 mg/dL  0.2   0.2    ?Alkaline Phos 38 - 126 U/L  190   175    ?AST 15 - 41 U/L  30   30    ?ALT 0 - 44 U/L  26   24    ? ? ?Lipid Panel  ?   ?Component Value Date/Time  ? CHOL 148 03/28/2018 1627  ? TRIG 67 03/28/2018 1627  ? HDL 57 03/28/2018 1627  ? CHOLHDL 2.6 03/28/2018 1627  ? [place] 78 03/28/2018 1627  ? ? ?CBC ?   ?Component Value Date/Time  ? WBC 8.8 06/23/2021 0038  ? RBC 4.66 06/23/2021 0038  ? HGB 13.6 06/23/2021 0038  ? HGB 12.9 05/15/2021 0857  ? HCT 40.9 06/23/2021  0038  ? HCT  38.4 05/15/2021 0857  ? PLT 307 06/23/2021 0038  ? PLT 303 05/15/2021 0857  ? MCV 87.8 06/23/2021 0038  ? MCV 89 05/15/2021 0857  ? MCH 29.2 06/23/2021 0038  ? MCHC 33.3 06/23/2021 0038  ? RDW 13.6 06/23/2021 0038  ? RDW 12.9 05/15/2021 0857  ? LYMPHSABS 1.9 01/28/2021 0946  ? EOSABS 0.1 01/28/2021 0946  ? BASOSABS 0.0 01/28/2021 0946  ? ? ?Lab Results  ?Component Value Date  ? HGBA1C 5.6 (A) 03/24/2022  ? ? ?Assessment & Plan:  ?1. History of gestational diabetes ?A1c of 5.6 ?Counseled on lifestyle modification to prevent prediabetes and progression to type 2 diabetes mellitus ?She has questions about possibility of gestational diabetes in future pregnancies which I have answered and assured her that the most effective way is to maintain disciplined lifestyle which will be beneficial for the future ?- POCT glycosylated hemoglobin (Hb A1C) ? ?2. Overweight (BMI 25.0-29.9) ?Counseled about beginning a regular exercise program including walking 1350 minutes of moderate intensity exercise per week ?Weight gain could also be due to Nexplanon ? ?3. Screening for metabolic disorder ?- LP+Non-HDL Cholesterol ?- CMP14+EGFR ?- CBC with Differential/Platelet ?- T4, free ?- TSH ? ? ? ?No orders of the defined types were placed in this encounter. ? ? ?Follow-up: Return in about 1 year (around 03/25/2023).  ? ? ? ? ? ?Charlott Rakes, MD, FAAFP. ?[place] ?[place], [place] ?[phone]   ?03/24/2022, 12:01 PM ?

## 2022-03-24 NOTE — Patient Instructions (Signed)
Hacer ejercicio para bajar de peso Exercising to Lose Weight Hacer ejercicio de forma regular es importante para todos. Es especialmente importante si tiene sobrepeso. El sobrepeso aumenta el riesgo de tener enfermedad cardaca, accidente cerebrovascular, diabetes, presin arterial alta y varios tipos de cncer. Hacer ejercicio y reducir las caloras que consume puede ayudarle a bajar de peso y a mejorar su estado fsico y su salud. El ejercicio puede ser de intensidad moderada o vigorosa. Para bajar de peso, la mayora de las personas debe hacer una determinada cantidad de ejercicio de intensidad moderada o vigorosa cada semana. Cmo puede afectarme el ejercicio? Cuando hace suficiente ejercicio como para quemar ms caloras que las que consume, baja de peso. Tambin reduce la grasa corporal y aumenta la masa muscular. Cuanto ms msculo tenga, mayor cantidad de caloras quemar. El ejercicio tambin: Mejora el estado de nimo. Reduce el estrs y las tensiones. Mejora el estado fsico general, la flexibilidad y la resistencia. Aumenta la fuerza sea. Ejercicio de intensidad moderada  El ejercicio de intensidad moderada es cualquier actividad que lo haga moverse lo suficiente como para quemar al menos tres veces ms energa (caloras) que si estuviera sentado. Algunos ejemplos de ejercicio de intensidad moderada son: Caminar una milla (1,6 kilmetros) en 15 minutos. Hacer trabajos de jardinera livianos. Andar en bicicleta a un ritmo fcil de aguantar. La mayora de las personas debe hacer al menos 150 minutos por semana de ejercicio de intensidad moderada para mantener su peso corporal. Ejercicio de intensidad vigorosa El ejercicio de intensidad vigorosa es cualquier actividad que lo haga moverse lo suficiente como para quemar al menos seis veces ms caloras que si estuviera sentado. Al hacer ejercicio a esta intensidad, su nivel de esfuerzo debera ser lo suficientemente alto como para no  permitirle mantener una conversacin. Algunos ejemplos de ejercicio de intensidad vigorosa son: Correr. Practicar un deporte de equipo, como ftbol americano, baloncesto y ftbol. Saltar la cuerda. La mayora de las personas debe hacer al menos 75 minutos por semana de ejercicio de intensidad vigorosa para mantener su peso corporal. Qu medidas puedo tomar para bajar de peso? La cantidad de ejercicio que necesita realizar para bajar de peso depende de: Su edad. El tipo de ejercicio. Cualquier afeccin de salud que tenga. Su capacidad fsica general. Pregntele al mdico cunto ejercicio debe realizar y qu tipos de actividades son seguras para usted. Nutricin  Haga cambios en la dieta como se lo haya indicado el mdico o especialista en alimentacin y nutricin (nutricionista). Esto puede incluir: Consumir menos caloras. Consumir ms protenas. Consumir menos grasas no saludables. Seguir una dieta que incluya frutas y verduras frescas, cereales integrales, productos lcteos semidescremados y protenas magras. Evite los alimentos con grasa, sal y azcar agregadas. Beba gran cantidad de agua mientras hace ejercicio para evitar la deshidratacin o los golpes de calor. Actividad Elija una actividad que disfrute y establezca objetivos realistas. El mdico puede ayudarlo a elaborar un plan de ejercicio que funcione para usted. Haga ejercicio a una intensidad moderada o vigorosa la mayora de los das de la semana. La intensidad de la actividad fsica puede variar de una persona a otra. Puede saber qu tan intensa una rutina de ejercicios es para usted al prestar atencin a su respiracin y latidos cardacos. La mayora de las personas notar que su respiracin y latidos cardacos se aceleran al realizar ejercicio de mayor intensidad. Haga entrenamiento de resistencia dos veces por semana, como: Flexiones de brazos. Abdominales. Levantamiento de pesas. Uso de bandas elsticas de    resistencia. Hacer ejercicio en perodos cortos de tiempo puede ser tan til como en perodos largos y estructurados. Si tiene problemas para encontrar tiempo para hacer ejercicio, intente hacer estas cosas como parte de su rutina diaria: Levntese, estrese y camine cada 30 minutos a lo largo del da. Vaya a caminar durante su hora de almuerzo. Estacione el auto lejos de su lugar de destino. Si usa transporte pblico, bjese una parada antes y camine el resto del camino. Pngase de pie y camine cada vez que hable por telfono. Utilice la escalera en lugar del ascensor o la escalera mecnica. Use ropa cmoda y calzado con buen soporte. No haga ejercicio en exceso que pudiera hacer que se lastime, se sienta mareado o tenga dificultad para respirar. Dnde buscar ms informacin U.S. Department of Health and Human Services (Departamento de Salud y Servicios Humanos de los [place]): www.hhs.gov Centers for Disease Control and Prevention (Centros para el Control y la Prevencin de Enfermedades): www.cdc.gov Comunquese con un mdico: Antes de comenzar un nuevo programa de ejercicios. Si tiene preguntas o inquietudes acerca de su peso. Si tiene un problema mdico que le impide hacer ejercicio. Solicite ayuda de inmediato si: Presenta algo de lo siguiente mientras hace ejercicio: Lesiones. Mareos. Dificultad para respirar o falta de aire que no desaparecen al dejar de hacer ejercicio. Dolor en el pecho. Latidos cardacos rpidos. Estos sntomas pueden representar un problema grave que constituye una emergencia. No espere a ver si los sntomas desaparecen. Solicite atencin mdica de inmediato. Comunquese con el servicio de emergencias de su localidad (911 en los [place]). No conduzca por sus propios medios hasta el hospital. Resumen Hacer ejercicio con regularidad es especialmente importante si tiene sobrepeso. El sobrepeso aumenta el riesgo de tener enfermedad cardaca, accidente  cerebrovascular, diabetes, presin arterial alta y varios tipos de cncer. Para perder peso debe quemar ms caloras que las que consume. Reducir la cantidad de caloras que consume, y hacer ejercicio de intensidad moderada o vigorosa todas las semanas, ayuda a bajar de peso. Esta informacin no tiene como fin reemplazar el consejo del mdico. Asegrese de hacerle al mdico cualquier pregunta que tenga. Document Revised: 01/19/2021 Document Reviewed: 01/19/2021 Elsevier Patient Education  2023 Elsevier Inc.  

## 2022-03-25 ENCOUNTER — Telehealth: Payer: Self-pay

## 2022-03-25 LAB — CBC WITH DIFFERENTIAL/PLATELET
Basophils Absolute: 0 10*3/uL (ref 0.0–0.2)
Basos: 0 %
EOS (ABSOLUTE): 0.1 10*3/uL (ref 0.0–0.4)
Eos: 1 %
Hematocrit: 40.4 % (ref 34.0–46.6)
Hemoglobin: 13.9 g/dL (ref 11.1–15.9)
Immature Grans (Abs): 0 10*3/uL (ref 0.0–0.1)
Immature Granulocytes: 0 %
Lymphocytes Absolute: 2.1 10*3/uL (ref 0.7–3.1)
Lymphs: 22 %
MCH: 29.6 pg (ref 26.6–33.0)
MCHC: 34.4 g/dL (ref 31.5–35.7)
MCV: 86 fL (ref 79–97)
Monocytes Absolute: 0.6 10*3/uL (ref 0.1–0.9)
Monocytes: 7 %
Neutrophils Absolute: 7 10*3/uL (ref 1.4–7.0)
Neutrophils: 70 %
Platelets: 304 10*3/uL (ref 150–450)
RBC: 4.7 x10E6/uL (ref 3.77–5.28)
RDW: 13 % (ref 11.7–15.4)
WBC: 9.9 10*3/uL (ref 3.4–10.8)

## 2022-03-25 LAB — CMP14+EGFR
ALT: 14 IU/L (ref 0–32)
AST: 19 IU/L (ref 0–40)
Albumin/Globulin Ratio: 1.5 (ref 1.2–2.2)
Albumin: 4.5 g/dL (ref 3.9–5.0)
Alkaline Phosphatase: 119 IU/L (ref 44–121)
BUN/Creatinine Ratio: 20 (ref 9–23)
BUN: 11 mg/dL (ref 6–20)
Bilirubin Total: 0.4 mg/dL (ref 0.0–1.2)
CO2: 22 mmol/L (ref 20–29)
Calcium: 9.3 mg/dL (ref 8.7–10.2)
Chloride: 101 mmol/L (ref 96–106)
Creatinine, Ser: 0.54 mg/dL — ABNORMAL LOW (ref 0.57–1.00)
Globulin, Total: 3 g/dL (ref 1.5–4.5)
Glucose: 75 mg/dL (ref 70–99)
Potassium: 3.8 mmol/L (ref 3.5–5.2)
Sodium: 139 mmol/L (ref 134–144)
Total Protein: 7.5 g/dL (ref 6.0–8.5)
eGFR: 127 mL/min/{1.73_m2} (ref >59)

## 2022-03-25 LAB — LP+NON-HDL CHOLESTEROL
Cholesterol, Total: 207 mg/dL — ABNORMAL HIGH (ref 100–199)
HDL: 54 mg/dL (ref >39)
LDL Chol Calc (NIH): 134 mg/dL — ABNORMAL HIGH (ref 0–99)
Total Non-HDL-Chol (LDL+VLDL): 153 mg/dL — ABNORMAL HIGH (ref 0–129)
Triglycerides: 109 mg/dL (ref 0–149)
VLDL Cholesterol Cal: 19 mg/dL (ref 5–40)

## 2022-03-25 LAB — TSH: TSH: 1.89 u[IU]/mL (ref 0.450–4.500)

## 2022-03-25 LAB — T4, FREE: Free T4: 1.36 ng/dL (ref 0.82–1.77)

## 2022-03-25 NOTE — Telephone Encounter (Signed)
[employer] [facility]  Id# [id number]  contacted pt to go over lab results  pt is aware and doesn't have any questions or concerns  ?

## 2023-03-28 ENCOUNTER — Ambulatory Visit: Payer: Self-pay | Admitting: Family Medicine

## 2023-04-04 ENCOUNTER — Telehealth: Payer: Self-pay | Admitting: Family Medicine

## 2023-04-04 NOTE — Telephone Encounter (Signed)
Copied from CRM [id number]. Topic: General - Other >> Apr 04, 2023  3:33 PM Patsy Lager T wrote: Reason for CRM: patient wants her A1c checked and needs someone to call her back to let her know if she would need an appt to see the provider or if she can just have the test done

## 2023-04-21 ENCOUNTER — Encounter: Payer: Self-pay | Admitting: Family Medicine

## 2023-05-05 IMAGING — US US FETAL BPP W/ NON-STRESS
1 series · 13 of 14 positions shown · non-contrast
Comparison: none

[Series 1: us fetal bpp w/ non-stress · 14 acquisitions, 13 frames shown]
[im 1/14]
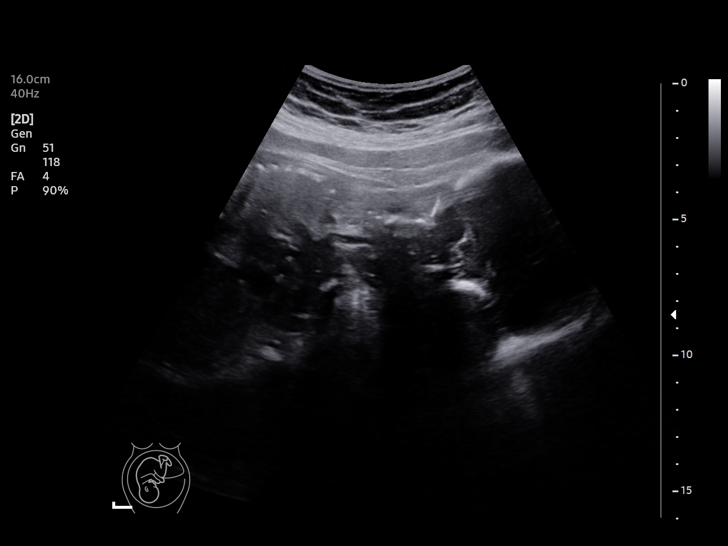
[im 2/14]
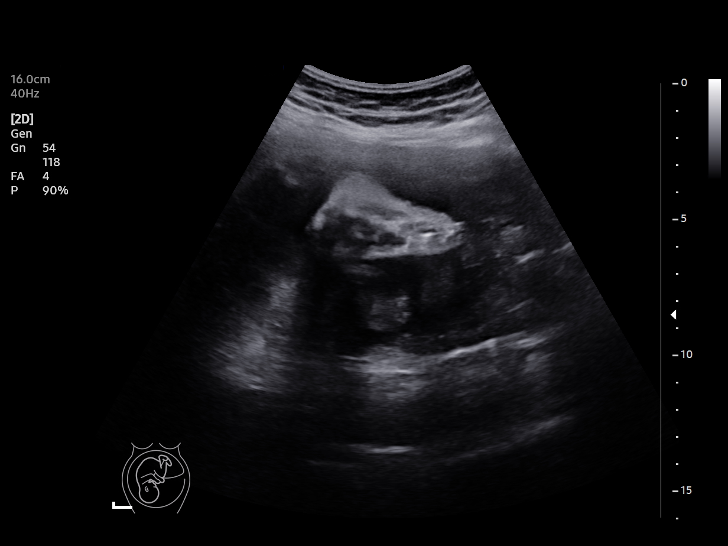
[im 3/14]
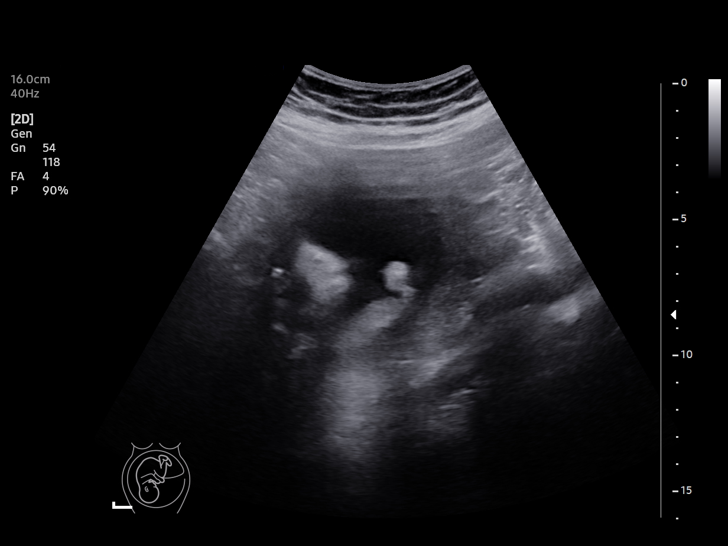
[im 4/14]
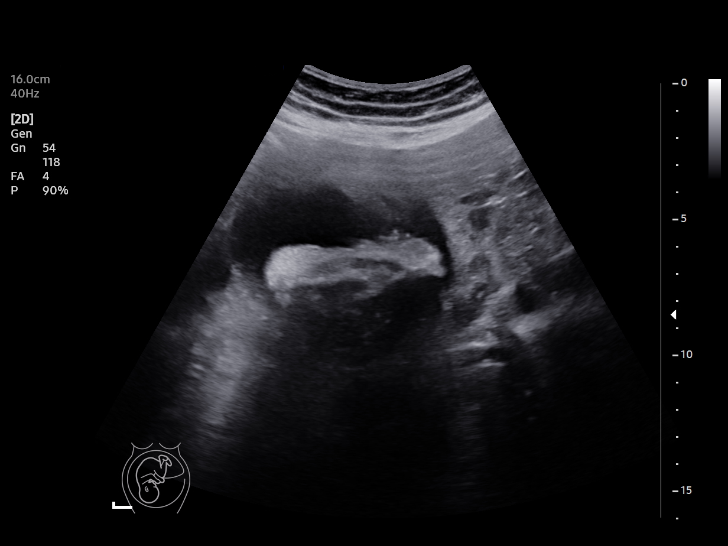
[im 5/14]
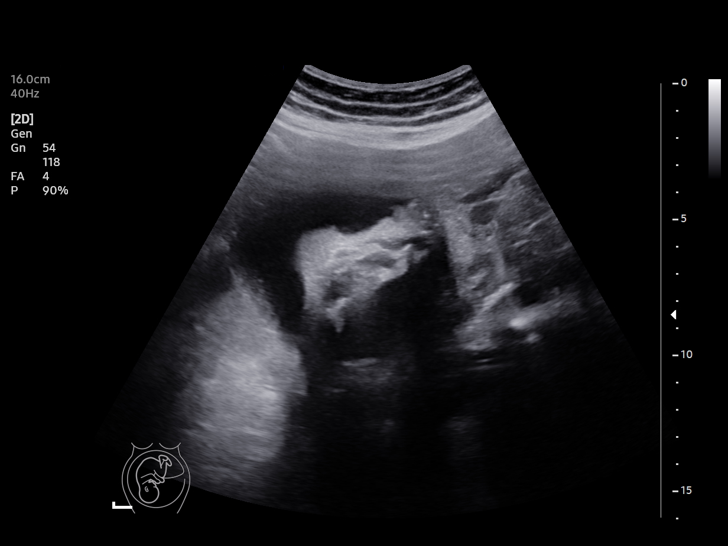
[im 6/14]
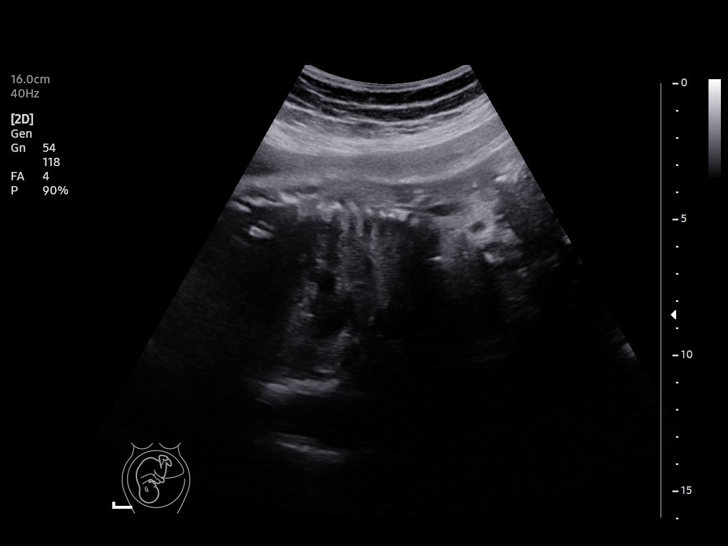
[im 8/14]
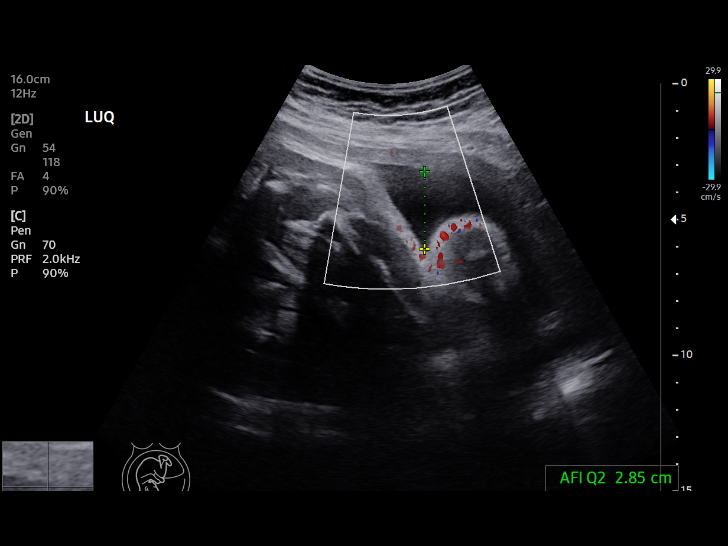
[im 9/14]
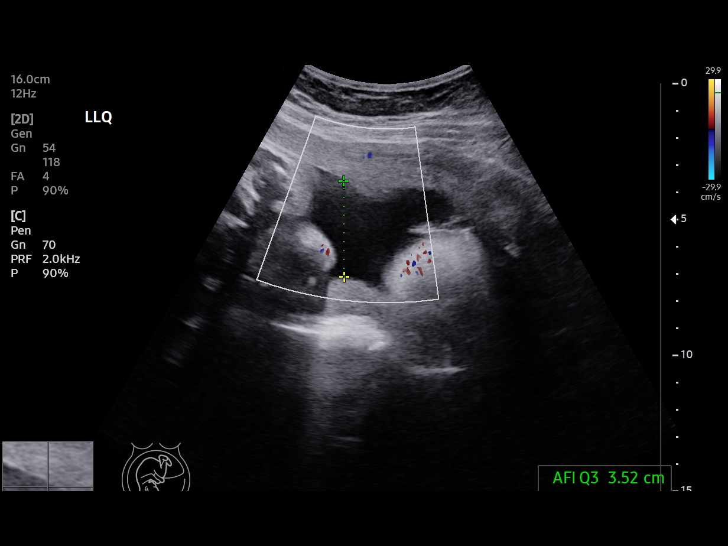
[im 10/14]
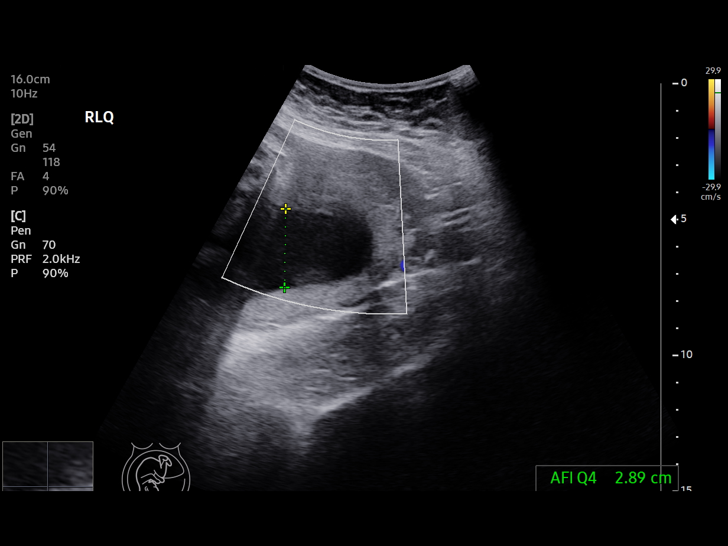
[im 11/14]
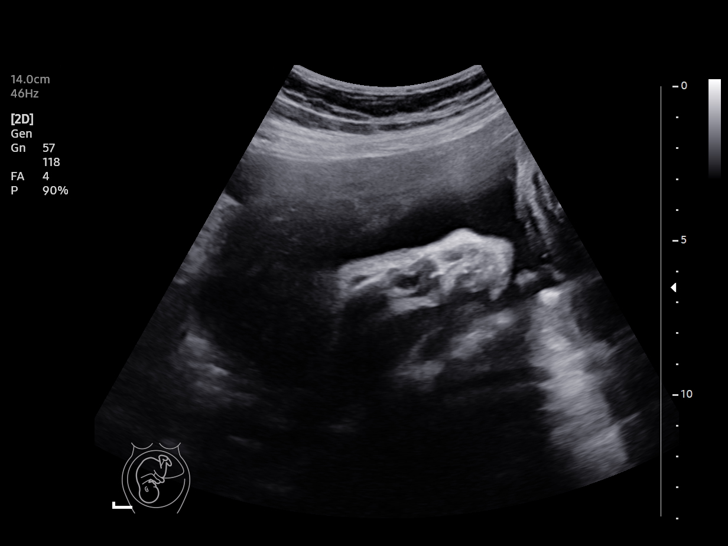
[im 12/14]
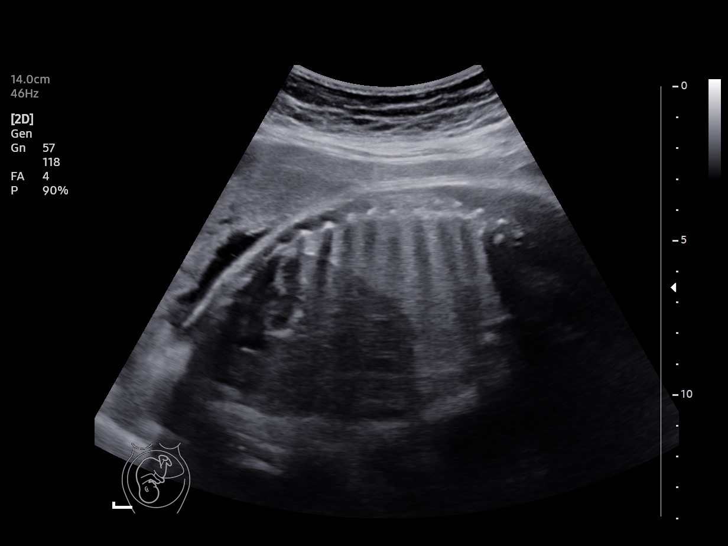
[im 13/14]
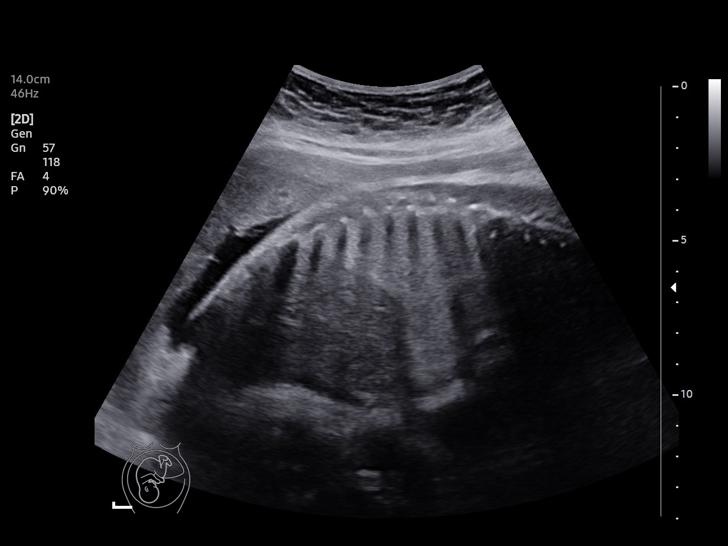
[im 14/14]
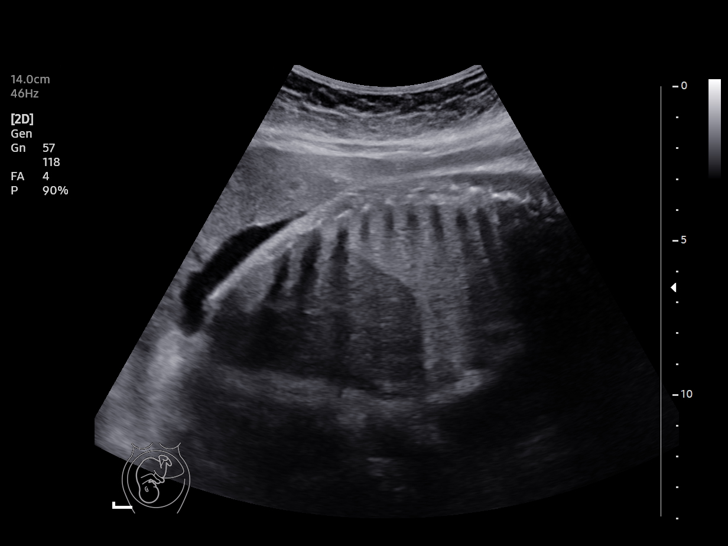

[13 of 14 positions shown; findings below may reference images not displayed]

[REDACTED]
                                                            [REDACTED]care at

 1  US FETAL BPP W/NONSTRESS              76818.4     DEYLA BORQUEZ

Service(s) Provided

Indications

 31 weeks gestation of pregnancy
 Cholestasis of pregnancy, third trimester      [id number]
Fetal Evaluation

 Num Of Fetuses:         1
 Preg. Location:         Intrauterine
 Cardiac Activity:       Observed
 Presentation:           Cephalic

 Amniotic Fluid
 AFI FV:      Within normal limits

 AFI Sum(cm)     %Tile       Largest Pocket(cm)
 12.07           32

 RUQ(cm)       RLQ(cm)       LUQ(cm)        LLQ(cm)

Biophysical Evaluation
 Amniotic F.V:   Pocket => 2 cm             F. Tone:        Observed
 F. Movement:    Observed                   N.S.T:          Reactive
 F. Breathing:   Observed                   Score:          [DATE]
OB History

 Gravidity:    2
 Living:       1
Gestational Age

 LMP:           28w 2d        Date:  11/18/20                 EDD:   08/25/21
 Best:          31w 5d     Det. By:  U/S  (03/31/21)          EDD:   08/01/21
Impression

 Viable intrauterine pregnancy with BPP [DATE]
Recommendations

 Continue Continue weekly antenatal testing till delivery .
                MANRICA SABA

## 2023-05-09 IMAGING — US US FETAL BPP W/ NON-STRESS
1 series · 13 of 14 positions shown · non-contrast
Comparison: none

[Series 1: us fetal bpp w/ non-stress · 14 acquisitions, 13 frames shown]
[im 1/14]
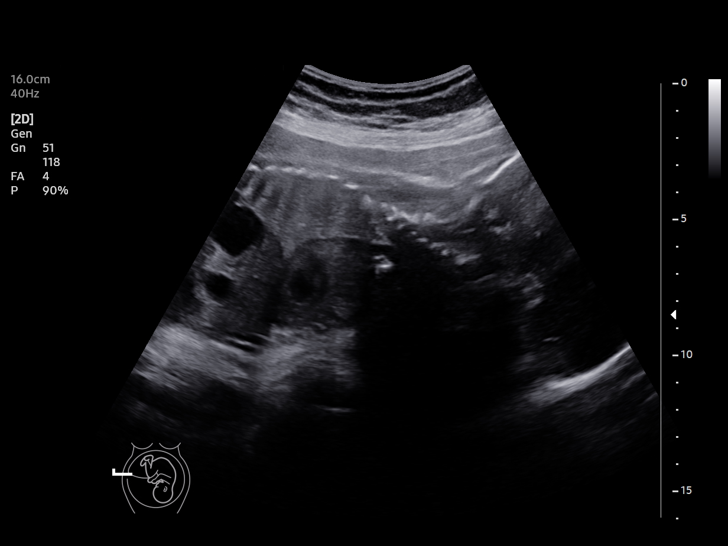
[im 2/14]
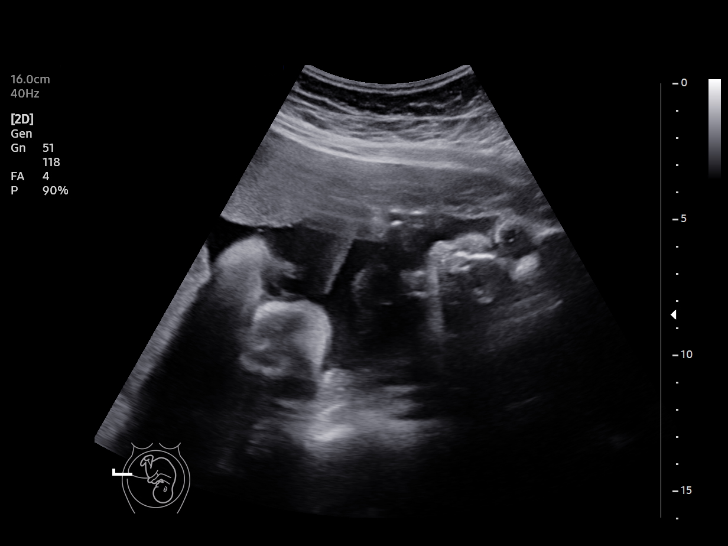
[im 3/14]
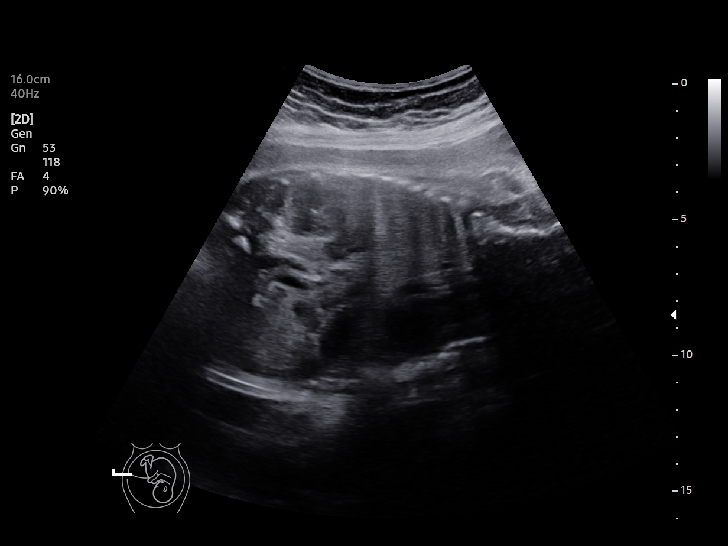
[im 4/14]
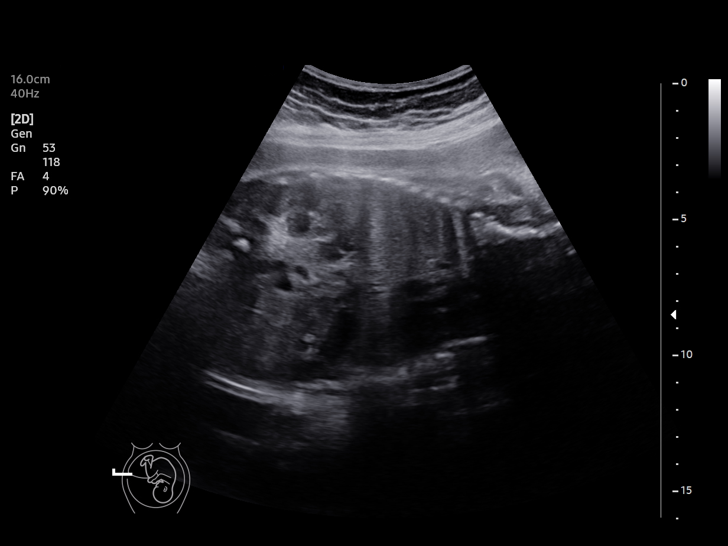
[im 5/14]
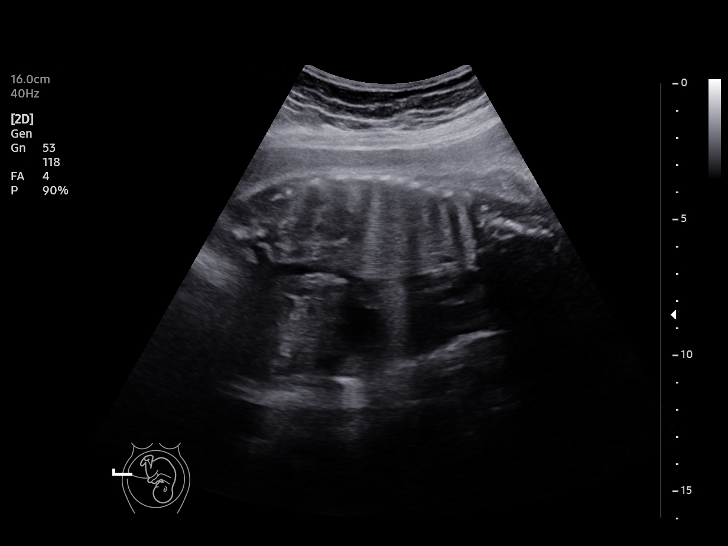
[im 6/14]
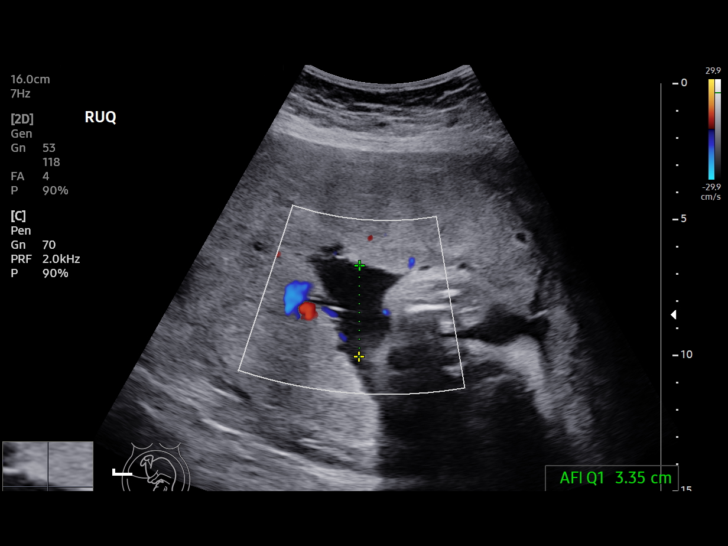
[im 8/14]
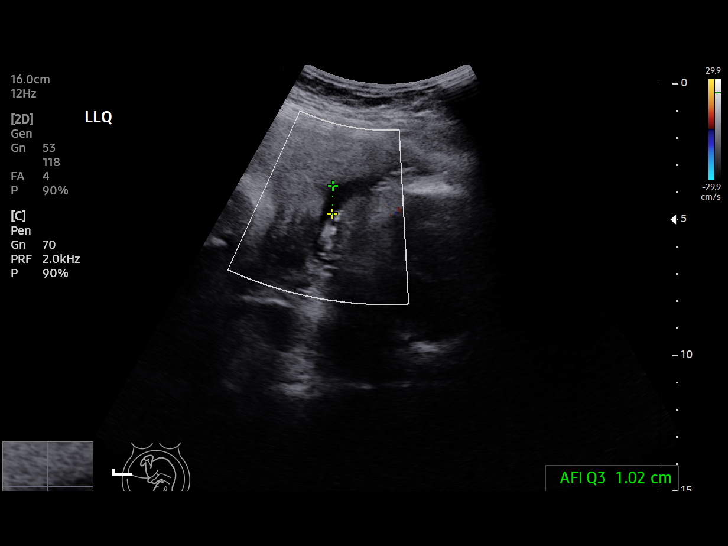
[im 9/14]
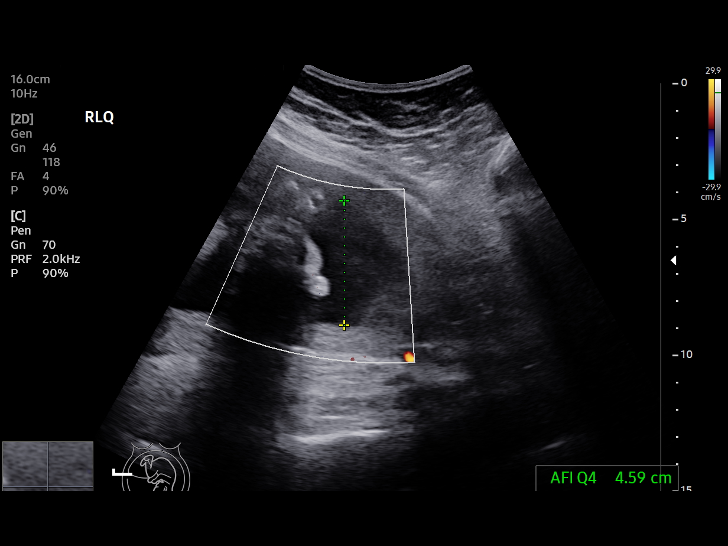
[im 10/14]
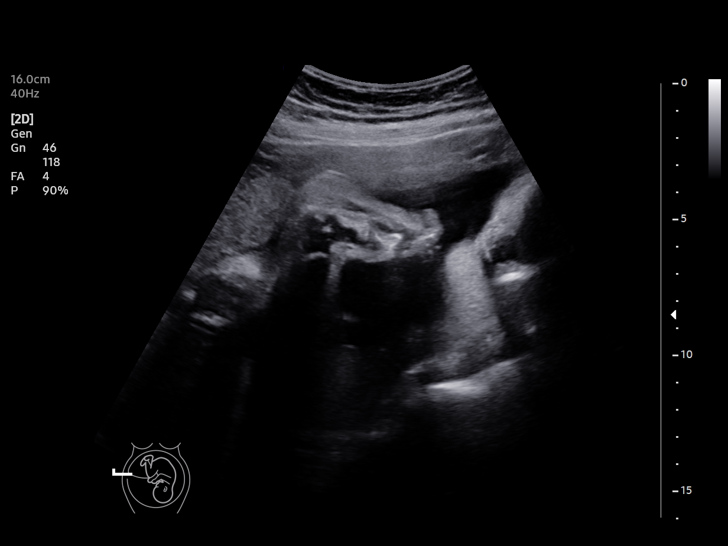
[im 11/14]
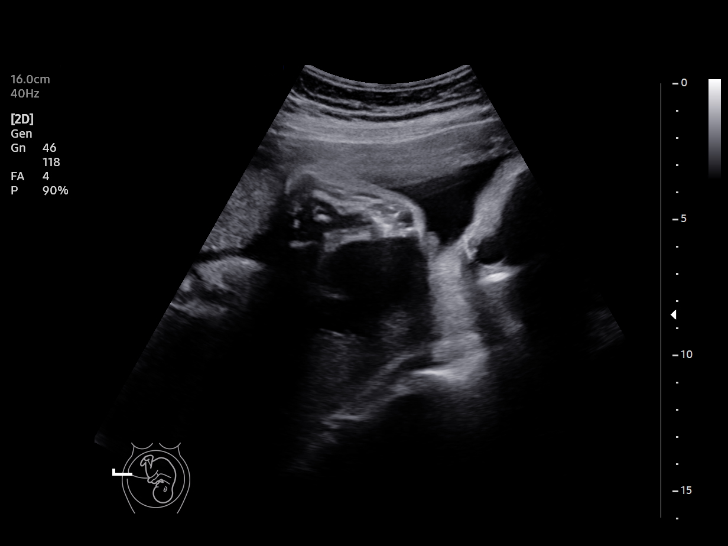
[im 12/14]
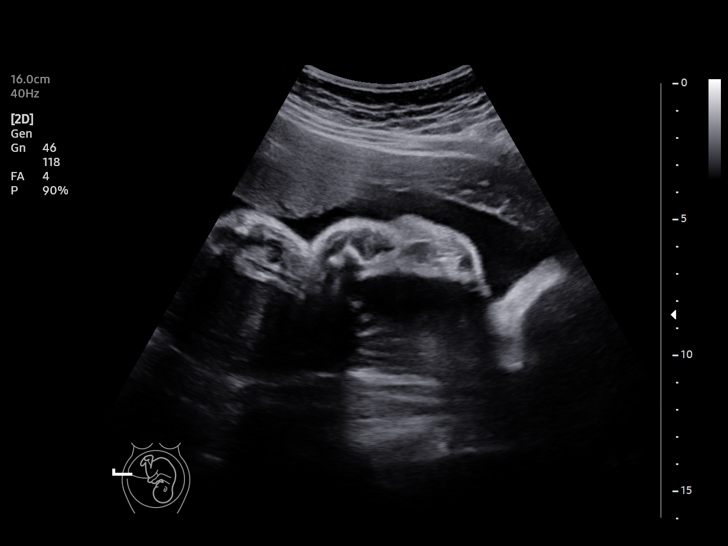
[im 13/14]
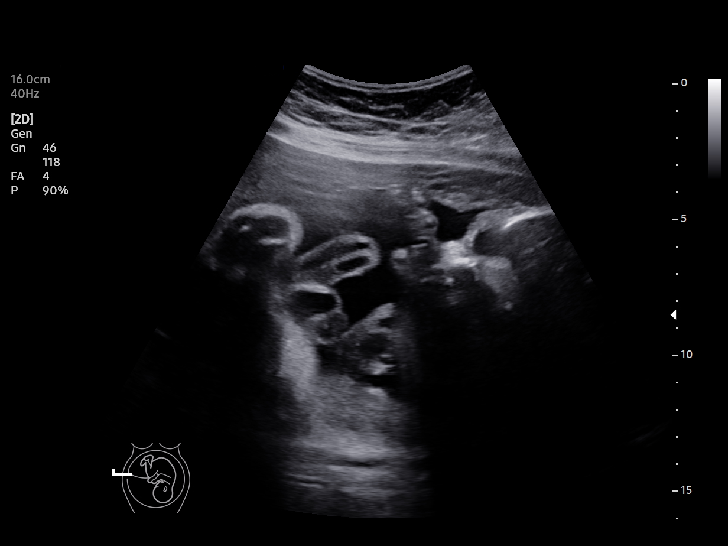
[im 14/14]
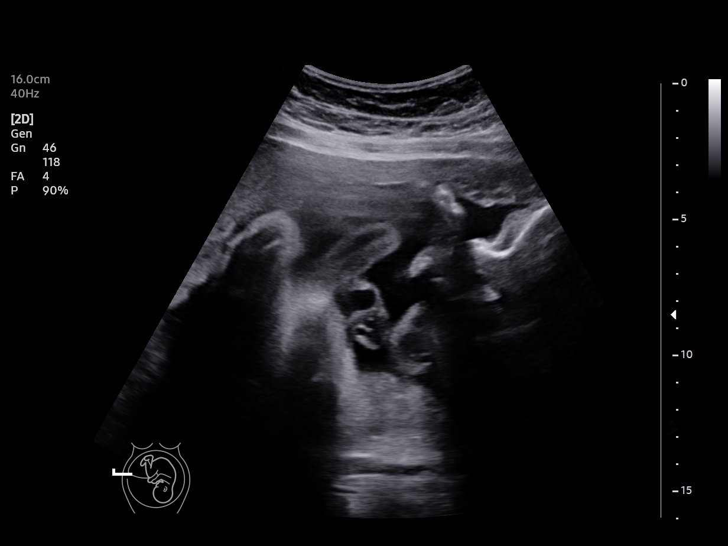

[13 of 14 positions shown; findings below may reference images not displayed]

[REDACTED]
                                                             [REDACTED]care at

 1  US FETAL BPP W/NONSTRESS              76818.4     CRUSITO TURNAGE

Service(s) Provided

Indications

 32 weeks gestation of pregnancy
 Cholestasis of pregnancy, third trimester       [id number]
Fetal Evaluation

 Num Of Fetuses:          1
 Preg. Location:          Intrauterine
 Cardiac Activity:        Observed
 Presentation:            Cephalic

 Amniotic Fluid
 AFI FV:      Within normal limits

 AFI Sum(cm)     %Tile       Largest Pocket(cm)
 12.51           36

 RUQ(cm)       RLQ(cm)       LUQ(cm)        LLQ(cm)

Biophysical Evaluation
 Amniotic F.V:   Pocket => 2 cm             F. Tone:         Observed
 F. Movement:    Observed                   N.S.T:           Reactive
 F. Breathing:   Observed                   Score:           [DATE]
OB History

 Gravidity:    2
 Living:       1
Gestational Age

 LMP:           28w 6d        Date:  11/18/20                 EDD:   08/25/21
 Best:          32w 2d     Det. By:  U/S  (03/31/21)          EDD:   08/01/21
Impression

 NST is reactive. BPP [DATE].
Recommendations

 Continue weekly antenatal testing till delivery .

## 2023-05-18 ENCOUNTER — Encounter: Payer: Self-pay | Admitting: Family Medicine

## 2023-06-02 ENCOUNTER — Ambulatory Visit: Payer: Self-pay | Admitting: *Deleted

## 2023-06-02 NOTE — Telephone Encounter (Signed)
Interpreter Paulina ID # [id number]   Summary: for a few months, has been having a period every day.   Pt is asking if she can have her birth control removed at her upcoming physical on 08/12. Mentioned that for a few months, has been having a period every day. Denied pain.  Seeking clinical advice.           Chief Complaint: vaginal bleeding, birth control options Symptoms: vaginal spotting every day . Has implanted birth control in arm. Reddish to brown in color. Frequency: a few months  Pertinent Negatives: Patient denies abdominal pain no heavy bleeding. Disposition: [] ED /[] Urgent Care (no appt availability in office) / [] Appointment(In office/virtual)/ []  [facility]/ [] Home Care/ [] Refused Recommended Disposition /[] [facility]/ [x]  Follow-up with PCP Additional Notes:   Next appt 06/27/23. Patient requesting if she can have birth control implant removed from her arm and choose another birth control option during OV. Please advise if additional OV needed.     Reason for Disposition  [1] Bleeding or spotting between regular periods AND [2] occurs more than three cycles (3 months) AND [3] using birth control medicine (pills, patch, Depo-Provera, Implanon, vaginal ring, Mirena IUD)  Answer Assessment - Initial Assessment Questions 1. AMOUNT: "Describe the bleeding that you are having."    - SPOTTING: spotting, or pinkish / brownish mucous discharge; does not fill panty liner or pad    - MILD:  less than 1 pad / hour; less than patient's usual menstrual bleeding   - MODERATE: 1-2 pads / hour; 1 menstrual cup every 6 hours; small-medium blood clots (e.g., pea, grape, small coin)   - SEVERE: soaking 2 or more pads/hour for 2 or more hours; 1 menstrual cup every 2 hours; bleeding not contained by pads or continuous red blood from vagina; large blood clots (e.g., golf ball, large coin)      Spotting reddish brown  2. ONSET: "When did the bleeding begin?" "Is it  continuing now?"     Few months  3. MENSTRUAL PERIOD: "When was the last normal menstrual period?" "How is this different than your period?"     Spotting everyday  4. REGULARITY: "How regular are your periods?"     Na  5. ABDOMEN PAIN: "Do you have any pain?" "How bad is the pain?"  (e.g., Scale 1-10; mild, moderate, or severe)   - MILD (1-3): doesn't interfere with normal activities, abdomen soft and not tender to touch    - MODERATE (4-7): interferes with normal activities or awakens from sleep, abdomen tender to touch    - SEVERE (8-10): excruciating pain, doubled over, unable to do any normal activities      Denies  6. PREGNANCY: "Is there any chance you are pregnant?" "When was your last menstrual period?"     na 7. BREASTFEEDING: "Are you breastfeeding?"     na 8. HORMONE MEDICINES: "Are you taking any hormone medicines, prescription or over-the-counter?" (e.g., birth control pills, estrogen)     Birth control implant in arm 9. BLOOD THINNER MEDICINES: "Do you take any blood thinners?" (e.g., Coumadin / warfarin, Pradaxa / dabigatran, aspirin)     na 10. CAUSE: "What do you think is causing the bleeding?" (e.g., recent gyn surgery, recent gyn procedure; known bleeding disorder, cervical cancer, polycystic ovarian disease, fibroids)         Not sure  11. HEMODYNAMIC STATUS: "Are you weak or feeling lightheaded?" If Yes, ask: "Can you stand and walk normally?"  No sx 12. OTHER SYMPTOMS: "What other symptoms are you having with the bleeding?" (e.g., passed tissue, vaginal discharge, fever, menstrual-type cramps)       None  Protocols used: Vaginal Bleeding - Abnormal-A-AH

## 2023-06-06 NOTE — Telephone Encounter (Signed)
Call placed to patient unable to reach message left on VM.   

## 2023-06-06 NOTE — Telephone Encounter (Signed)
Unfortunately I cannot combine both visits into one. Procedure take time. She will need two separate visits. I am happy to do whichever one she prefers first.

## 2023-06-27 ENCOUNTER — Other Ambulatory Visit: Payer: Self-pay

## 2023-06-27 ENCOUNTER — Ambulatory Visit: Payer: Self-pay | Attending: Family Medicine | Admitting: Family Medicine

## 2023-06-27 ENCOUNTER — Encounter: Payer: Self-pay | Admitting: Family Medicine

## 2023-06-27 VITALS — BP 101/69 | HR 57 | Ht 58.5 in | Wt 140.6 lb

## 2023-06-27 DIAGNOSIS — Z0001 Encounter for general adult medical examination with abnormal findings: Secondary | ICD-10-CM

## 2023-06-27 DIAGNOSIS — Z13228 Encounter for screening for other metabolic disorders: Secondary | ICD-10-CM

## 2023-06-27 DIAGNOSIS — N939 Abnormal uterine and vaginal bleeding, unspecified: Secondary | ICD-10-CM

## 2023-06-27 DIAGNOSIS — I8392 Asymptomatic varicose veins of left lower extremity: Secondary | ICD-10-CM

## 2023-06-27 DIAGNOSIS — Z30011 Encounter for initial prescription of contraceptive pills: Secondary | ICD-10-CM

## 2023-06-27 MED ORDER — NORETHINDRONE 0.35 MG PO TABS
1.0000 | ORAL_TABLET | Freq: Every day | ORAL | 11 refills | Status: DC
  Filled 2023-06-27: qty 84, 84d supply, fill #0

## 2023-06-27 NOTE — Progress Notes (Signed)
Subjective:  Patient ID: Carrie Delacruz, female    DOB: November 02, 1991  Age: 32 y.o. MRN: [id number]  CC: Annual Exam   HPI Daizha Alphonse is a 32 y.o. year old female with a history of Gestational DM here for an annual visit.  Interval History: Discussed the use of AI scribe software for clinical note transcription with the patient, who gave verbal consent to proceed.  She presents for a physical examination and to discuss the removal of her contraceptive implant. She was informed that the implant was effective for two years, and she is now concerned about its continued use. She also reports having irregular periods for several months, with only a few days break in between. She describes the bleeding as not heavy, but notes that she has been passing clots. She also reports experiencing pain in her lower abdomen during her periods.  Symptoms have been present for the last couple of months.  Initially when her implant was inserted she was amenorrheic but is unable to recall the duration of this unscheduled menstrual bleed.  In addition, the patient has noticed the development of varicose veins in her feet, which she is concerned about. She denies any exercise routine and reports not having seen a dentist regularly. She has not had breakfast on the day of the visit.        Past Medical History:  Diagnosis Date   Cholestasis    Gestational diabetes mellitus (GDM) in third trimester 05/16/2021   Intrahepatic cholestasis of pregnancy, antepartum 05/17/2021   05/15/21 Bile acids 16.6, nml LFTs >>Ursodiol prescribed.   Metrorrhagia    Pruritus of pregnancy in third trimester 05/15/2021    Past Surgical History:  Procedure Laterality Date   NO PAST SURGERIES      Family History  Problem Relation Age of Onset   Diabetes Mother    Asthma Mother     Social History   Socioeconomic History   Marital status: Single    Spouse name: Not on file   Number of  children: Not on file   Years of education: Not on file   Highest education level: Not on file  Occupational History   Not on file  Tobacco Use   Smoking status: Never   Smokeless tobacco: Never  Vaping Use   Vaping status: Never Used  Substance and Sexual Activity   Alcohol use: Never   Drug use: Never   Sexual activity: Yes    Birth control/protection: None    Comment: Depo Provera  Other Topics Concern   Not on file  Social History Narrative   Not on file   Social Determinants of Health   Financial Resource Strain: Not on file  Food Insecurity: Food Insecurity Present (08/07/2021)   Hunger Vital Sign    Worried About Running Out of Food in the Last Year: Sometimes true    Ran Out of Food in the Last Year: Sometimes true  Transportation Needs: No Transportation Needs (08/07/2021)   PRAPARE - Administrator, Civil Service (Medical): No    Lack of Transportation (Non-Medical): No  Physical Activity: Not on file  Stress: Not on file  Social Connections: Not on file    No Known Allergies  Outpatient Medications Prior to Visit  Medication Sig Dispense Refill   acetaminophen (TYLENOL) 500 MG tablet Take 2 tablets (1,000 mg total) by mouth every 6 (six) hours as needed for mild pain. (Patient not taking: Reported on 09/22/2021) 60  tablet 2   ibuprofen (ADVIL) 600 MG tablet Take 1 tablet (600 mg total) by mouth every 6 (six) hours as needed for moderate pain, headache or cramping. (Patient not taking: Reported on 09/22/2021) 30 tablet 2   Prenatal Vit-Fe Fumarate-FA (PRENATAL MULTIVITAMIN) TABS tablet Take 1 tablet by mouth daily at 12 noon. (Patient not taking: Reported on 09/22/2021)     senna-docusate (SENOKOT-S) 8.6-50 MG tablet Take 2 tablets by mouth at bedtime as needed for moderate constipation. (Patient not taking: Reported on 09/22/2021) 30 tablet 2   No facility-administered medications prior to visit.     ROS Review of Systems  Constitutional:  Negative  for activity change and appetite change.  HENT:  Negative for sinus pressure and sore throat.   Respiratory:  Negative for chest tightness, shortness of breath and wheezing.   Cardiovascular:  Negative for chest pain and palpitations.  Gastrointestinal:  Negative for abdominal distention, abdominal pain and constipation.  Genitourinary: Negative.   Musculoskeletal: Negative.   Psychiatric/Behavioral:  Negative for behavioral problems and dysphoric mood.     Objective:  BP 101/69   Pulse (!) 57   Ht 4' 10.5" (1.486 m)   Wt 140 lb 9.6 oz (63.8 kg)   SpO2 99%   BMI 28.89 kg/m      06/27/2023   10:19 AM 03/24/2022   11:30 AM 09/22/2021    8:58 AM  BP/Weight  Systolic BP 101 124 123  Diastolic BP 69 88 84  Wt. (Lbs) 140.6 139.6 132.4  BMI 28.89 kg/m2 28.68 kg/m2 27.2 kg/m2      Physical Exam Constitutional:      General: She is not in acute distress.    Appearance: She is well-developed. She is not diaphoretic.  HENT:     Head: Normocephalic.     Right Ear: External ear normal.     Left Ear: External ear normal.     Nose: Nose normal.     Mouth/Throat:     Mouth: Mucous membranes are moist.  Eyes:     Extraocular Movements: Extraocular movements intact.     Conjunctiva/sclera: Conjunctivae normal.     Pupils: Pupils are equal, round, and reactive to light.  Neck:     Vascular: No JVD.  Cardiovascular:     Rate and Rhythm: Regular rhythm. Bradycardia present.     Pulses: Normal pulses.     Heart sounds: Normal heart sounds. No murmur heard.    No gallop.  Pulmonary:     Effort: Pulmonary effort is normal. No respiratory distress.     Breath sounds: Normal breath sounds. No wheezing or rales.  Chest:     Chest wall: No tenderness.  Abdominal:     General: Bowel sounds are normal. There is no distension.     Palpations: Abdomen is soft. There is no mass.     Tenderness: There is no abdominal tenderness.  Musculoskeletal:        General: No tenderness. Normal  range of motion.     Cervical back: Normal range of motion.  Skin:    General: Skin is warm and dry.  Neurological:     Mental Status: She is alert and oriented to person, place, and time.     Deep Tendon Reflexes: Reflexes are normal and symmetric.  Psychiatric:        Mood and Affect: Mood normal.        Latest Ref Rng & Units 03/24/2022   11:57 AM 06/24/2021  4:24 AM 06/23/2021   12:38 AM  CMP  Glucose 70 - 99 mg/dL 75  94  295   BUN 6 - 20 mg/dL 11   11   Creatinine 2.84 - 1.00 mg/dL 1.32   4.40   Sodium 102 - 144 mmol/L 139   135   Potassium 3.5 - 5.2 mmol/L 3.8   3.6   Chloride 96 - 106 mmol/L 101   104   CO2 20 - 29 mmol/L 22   20   Calcium 8.7 - 10.2 mg/dL 9.3   9.4   Total Protein 6.0 - 8.5 g/dL 7.5   7.2   Total Bilirubin 0.0 - 1.2 mg/dL 0.4   0.2   Alkaline Phos 44 - 121 IU/L 119   190   AST 0 - 40 IU/L 19   30   ALT 0 - 32 IU/L 14   26     Lipid Panel     Component Value Date/Time   CHOL 207 (H) 03/24/2022 1157   TRIG 109 03/24/2022 1157   HDL 54 03/24/2022 1157   CHOLHDL 2.6 03/28/2018 1627   LDLCALC 134 (H) 03/24/2022 1157    CBC    Component Value Date/Time   WBC 9.9 03/24/2022 1157   WBC 8.8 06/23/2021 0038   RBC 4.70 03/24/2022 1157   RBC 4.66 06/23/2021 0038   HGB 13.9 03/24/2022 1157   HCT 40.4 03/24/2022 1157   PLT 304 03/24/2022 1157   MCV 86 03/24/2022 1157   MCH 29.6 03/24/2022 1157   MCH 29.2 06/23/2021 0038   MCHC 34.4 03/24/2022 1157   MCHC 33.3 06/23/2021 0038   RDW 13.0 03/24/2022 1157   LYMPHSABS 2.1 03/24/2022 1157   EOSABS 0.1 03/24/2022 1157   BASOSABS 0.0 03/24/2022 1157    Lab Results  Component Value Date   HGBA1C 5.6 (A) 03/24/2022    Assessment & Plan:    Annual visit for general medical exam with abnormal findings Counseled on increasing fiber intake, fruits and vegetable, limit intake of foods like cheese, white bread, white rice  Visit for initiation of oral contraceptive Patient reports implant was  placed for two years and is due for removal. She has been experiencing prolonged and irregular menstrual bleeding. -Schedule separate visit for implant removal in one month. -Write prescription for contraceptive pills to start after implant removal.  Menstrual Irregularity Patient reports prolonged and irregular menstrual bleeding. This started after the first year of having the contraceptive implant. -Plan to monitor symptoms after removal of contraceptive implant. -Consider pelvic ultrasound if symptoms persist after implant removal.  Varicose Veins Patient reports development of varicose veins in her feet. -Recommend use of compression stockings to help alleviate symptoms.   General Health Maintenance -Order blood tests to check cholesterol, kidney and liver function, and screen for diabetes. -Recommend regular exercise, at least 150 minutes of walking or other exercise per week. -Recommend intake of at least five servings of fruits and vegetables daily. -Place referral for dental cleaning and check-up. -Schedule follow-up visit after lab results are available.          Meds ordered this encounter  Medications   norethindrone (ORTHO MICRONOR) 0.35 MG tablet    Sig: Take 1 tablet (0.35 mg total) by mouth daily.    Dispense:  28 tablet    Refill:  11    Follow-up: Return in about 1 month (around 07/28/2023) for Nexplanon .       Hoy Register, MD, FAAFP. [facility] [place], [place] [phone]   06/27/2023, 10:53 AM

## 2023-06-27 NOTE — Patient Instructions (Signed)

## 2023-06-28 ENCOUNTER — Other Ambulatory Visit: Payer: Self-pay

## 2023-08-08 ENCOUNTER — Ambulatory Visit: Payer: Self-pay | Admitting: Family Medicine

## 2023-09-07 ENCOUNTER — Ambulatory Visit: Payer: Self-pay | Attending: Family Medicine | Admitting: Family Medicine

## 2023-09-07 ENCOUNTER — Encounter: Payer: Self-pay | Admitting: Family Medicine

## 2023-09-07 VITALS — BP 126/78 | HR 87 | Ht 58.5 in | Wt 142.0 lb

## 2023-09-07 DIAGNOSIS — Z3046 Encounter for surveillance of implantable subdermal contraceptive: Secondary | ICD-10-CM

## 2023-09-07 DIAGNOSIS — Z30017 Encounter for initial prescription of implantable subdermal contraceptive: Secondary | ICD-10-CM

## 2023-09-07 NOTE — Progress Notes (Signed)
Subjective:  Patient ID: Carrie Delacruz, female    DOB: 07-Jul-1991  Age: 32 y.o. MRN: [id number]  CC: nexplanon removal   HPI Carrie Delacruz is a 32 y.o. year old female with no significant past medical history here for an office visit.  She presents for removal of Nexplanon which she has had in place for 2 years.  She was informed by the clinician who placed her that it would last 2 years.  She would also like reinsertion of a new Nexplanon . Denies any problems or concerns today.    Past Medical History:  Diagnosis Date   Cholestasis    Gestational diabetes mellitus (GDM) in third trimester 05/16/2021   Intrahepatic cholestasis of pregnancy, antepartum 05/17/2021   05/15/21 Bile acids 16.6, nml LFTs >>Ursodiol prescribed.   Metrorrhagia    Pruritus of pregnancy in third trimester 05/15/2021    Past Surgical History:  Procedure Laterality Date   NO PAST SURGERIES      Family History  Problem Relation Age of Onset   Diabetes Mother    Asthma Mother     Social History   Socioeconomic History   Marital status: Single    Spouse name: Not on file   Number of children: Not on file   Years of education: Not on file   Highest education level: Not on file  Occupational History   Not on file  Tobacco Use   Smoking status: Never   Smokeless tobacco: Never  Vaping Use   Vaping status: Never Used  Substance and Sexual Activity   Alcohol use: Never   Drug use: Never   Sexual activity: Yes    Birth control/protection: None    Comment: Depo Provera  Other Topics Concern   Not on file  Social History Narrative   Not on file   Social Determinants of Health   Financial Resource Strain: Not on file  Food Insecurity: Food Insecurity Present (08/07/2021)   Hunger Vital Sign    Worried About Running Out of Food in the Last Year: Sometimes true    Ran Out of Food in the Last Year: Sometimes true  Transportation Needs: No Transportation Needs  (08/07/2021)   PRAPARE - Administrator, Civil Service (Medical): No    Lack of Transportation (Non-Medical): No  Physical Activity: Not on file  Stress: Not on file  Social Connections: Not on file    No Known Allergies  Outpatient Medications Prior to Visit  Medication Sig Dispense Refill   acetaminophen (TYLENOL) 500 MG tablet Take 2 tablets (1,000 mg total) by mouth every 6 (six) hours as needed for mild pain. (Patient not taking: Reported on 09/22/2021) 60 tablet 2   ibuprofen (ADVIL) 600 MG tablet Take 1 tablet (600 mg total) by mouth every 6 (six) hours as needed for moderate pain, headache or cramping. (Patient not taking: Reported on 09/22/2021) 30 tablet 2   norethindrone (ORTHO MICRONOR) 0.35 MG tablet Take 1 tablet (0.35 mg total) by mouth daily. 28 tablet 11   Prenatal Vit-Fe Fumarate-FA (PRENATAL MULTIVITAMIN) TABS tablet Take 1 tablet by mouth daily at 12 noon. (Patient not taking: Reported on 09/22/2021)     senna-docusate (SENOKOT-S) 8.6-50 MG tablet Take 2 tablets by mouth at bedtime as needed for moderate constipation. (Patient not taking: Reported on 09/22/2021) 30 tablet 2   No facility-administered medications prior to visit.     ROS Review of Systems  Constitutional:  Negative for activity change  and appetite change.  HENT:  Negative for sinus pressure and sore throat.   Respiratory:  Negative for chest tightness, shortness of breath and wheezing.   Cardiovascular:  Negative for chest pain and palpitations.  Gastrointestinal:  Negative for abdominal distention, abdominal pain and constipation.  Genitourinary: Negative.   Musculoskeletal: Negative.   Psychiatric/Behavioral:  Negative for behavioral problems and dysphoric mood.     Objective:  BP 126/78   Pulse 87   Ht 4' 10.5" (1.486 m)   Wt 142 lb (64.4 kg)   SpO2 99%   BMI 29.17 kg/m      09/07/2023   11:40 AM 06/27/2023   10:19 AM 03/24/2022   11:30 AM  BP/Weight  Systolic BP 126 101 124   Diastolic BP 78 69 88  Wt. (Lbs) 142 140.6 139.6  BMI 29.17 kg/m2 28.89 kg/m2 28.68 kg/m2      Physical Exam Constitutional:      Appearance: She is well-developed.  Cardiovascular:     Rate and Rhythm: Normal rate.     Heart sounds: Normal heart sounds. No murmur heard. Pulmonary:     Effort: Pulmonary effort is normal.     Breath sounds: Normal breath sounds. No wheezing or rales.  Chest:     Chest wall: No tenderness.  Abdominal:     General: Bowel sounds are normal. There is no distension.     Palpations: Abdomen is soft. There is no mass.     Tenderness: There is no abdominal tenderness.  Musculoskeletal:        General: Normal range of motion.     Right lower leg: No edema.     Left lower leg: No edema.  Skin:    Comments: Nexplanon rod palpable in inferior medial aspect of left upper arm  Neurological:     Mental Status: She is alert and oriented to person, place, and time.  Psychiatric:        Mood and Affect: Mood normal.        Latest Ref Rng & Units 06/27/2023   11:05 AM 03/24/2022   11:57 AM 06/24/2021    4:24 AM  CMP  Glucose 70 - 99 mg/dL 79  75  94   BUN 6 - 20 mg/dL 12  11    Creatinine 4.09 - 1.00 mg/dL 8.11  9.14    Sodium 782 - 144 mmol/L 138  139    Potassium 3.5 - 5.2 mmol/L 4.3  3.8    Chloride 96 - 106 mmol/L 101  101    CO2 20 - 29 mmol/L 23  22    Calcium 8.7 - 10.2 mg/dL 9.3  9.3    Total Protein 6.0 - 8.5 g/dL 7.2  7.5    Total Bilirubin 0.0 - 1.2 mg/dL 0.4  0.4    Alkaline Phos 44 - 121 IU/L 97  119    AST 0 - 40 IU/L 17  19    ALT 0 - 32 IU/L 9  14      Lipid Panel     Component Value Date/Time   CHOL 207 (H) 03/24/2022 1157   TRIG 109 03/24/2022 1157   HDL 54 03/24/2022 1157   CHOLHDL 2.6 03/28/2018 1627   LDLCALC 134 (H) 03/24/2022 1157    CBC    Component Value Date/Time   WBC 7.4 06/27/2023 1105   WBC 8.8 06/23/2021 0038   RBC 5.00 06/27/2023 1105   RBC 4.66 06/23/2021 0038   HGB 14.2 06/27/2023  1105   HCT 43.2  06/27/2023 1105   PLT 285 06/27/2023 1105   MCV 86 06/27/2023 1105   MCH 28.4 06/27/2023 1105   MCH 29.2 06/23/2021 0038   MCHC 32.9 06/27/2023 1105   MCHC 33.3 06/23/2021 0038   RDW 13.2 06/27/2023 1105   LYMPHSABS 2.2 06/27/2023 1105   EOSABS 0.1 06/27/2023 1105   BASOSABS 0.0 06/27/2023 1105    Lab Results  Component Value Date   HGBA1C 5.9 (H) 06/27/2023    Assessment & Plan:  1. Nexplanon removal and insertion Informed consent obtained Sterile procedure maintained Skin prepped with Betadine Anesthesia provided with 2% lidocaine with epi Procedure performed and Nexplanon rod removed New Nexplanon rod inserted. Patient tolerated procedure well Pressure dressing applied to the arm and patient advised to keep this in place for 24 hours. Use NSAID as needed pain.                  No orders of the defined types were placed in this encounter.   Follow-up: No follow-ups on file.       Hoy Register, MD, FAAFP. [facility] [place], [place] [phone]   09/07/2023, 11:43 AM

## 2023-09-07 NOTE — Patient Instructions (Signed)
Etonogestrel Implant Qu es este medicamento? El ETONOGESTREL evita la ovulacin y [place]. Pertenece a un grupo de medicamentos llamados anticonceptivos. Este medicamento es una hormona del grupo de los progestgenos. Este medicamento puede ser utilizado para otros usos; si tiene alguna pregunta consulte con su proveedor de atencin mdica o con su farmacutico. MARCAS COMUNES: Implanon, Nexplanon Qu le debo informar a mi profesional de la salud antes de tomar este medicamento? Necesitan saber si usted presenta alguno de los siguientes problemas o situaciones: Sangrado vaginal anormal Cogulos sanguneos Enfermedad vascular Cncer de mama, crvix, endometrio, ovario, hgado o tero Diabetes Enfermedad de la vescula biliar Enfermedad cardiaca o ataque cardiaco reciente Presin arterial alta Nivel elevado de colesterol o triglicridos Enfermedad renal Enfermedad heptica Migraas Convulsiones Accidente cerebrovascular Uso de tabaco Una reaccin alrgica o inusual al etonogestrel, a otros medicamentos, alimentos, colorantes o conservantes Si est embarazada o buscando quedar embarazada Si est amamantando a un beb Cmo debo utilizar este medicamento? Su equipo de atencin inserta este dispositivo justo debajo de la piel en la parte interior del brazo. Hable con su equipo de atencin sobre el uso de este medicamento en nios. Puede requerir atencin especial. Sobredosis: Pngase en contacto inmediatamente con un centro toxicolgico o una sala de urgencia si usted cree que haya tomado demasiado medicamento.<br>ATENCIN: [employer] es solo para usted. No comparta este medicamento con nadie. Qu sucede si me olvido de una dosis? No se aplica en este caso. Qu puede interactuar con este medicamento? No use este medicamento con ninguno de los siguientes productos: Amprenavir Fosamprenavir Este medicamento tambin podra interactuar con los siguientes  productos: Acitretina Aprepitant Armodafinilo Bexaroteno Bosentano Carbamazepina Ciertos medicamentos antivirales para VIH o hepatitis Ciertos medicamentos para infecciones micticas, tales como fluconazol, ketoconazol, itraconazol o voriconazol Ciclosporina Felbamato Griseofulvina Lamotrigina Modafinilo Autoliv Fenitona Primidona Rifabutina Rifampicina Rifapentina Hierba de [address] Puede ser que esta lista no menciona todas las posibles interacciones. Informe a su profesional de [employer] de [employer] productos a base de hierbas, medicamentos de [place] o suplementos nutritivos que est tomando. Si usted fuma, consume bebidas alcohlicas o si utiliza drogas ilegales, indqueselo tambin a su profesional de [employer]. Algunas sustancias pueden interactuar con su medicamento. A qu debo estar atento al usar [employer]? Visite a su equipo de atencin para que revise su evolucin peridicamente. Usar [employer] no los protege ni a usted ni a su pareja de la infeccin por VIH ni de ninguna otra infeccin de transmisin sexual. Usted debe poder sentir el implante al presionar la piel donde se insert con la yema de los dedos. Contacte a su equipo de atencin si no puede sentir el implante, y use un mtodo anticonceptivo no hormonal (como condones) hasta que su equipo de atencin confirme que el implante est en su Environmental consultant. Contacte a su equipo de atencin si cree que el implante puede haberse roto o doblado dentro del brazo. Su equipo de Aeronautical engineer una tarjeta de usuario despus de insertar el implante. La tarjeta es un registro de la ubicacin del implante en su brazo e indica cundo debe retirarse. Conserve esta tarjeta con sus registros mdicos. Qu efectos secundarios puedo tener al [employer] este medicamento? Efectos secundarios que debe informar a su equipo de atencin tan pronto como sea posible: Reacciones alrgicas: erupcin  cutnea, comezn/picazn, urticaria, hinchazn de la cara, los labios, la lengua o la garganta Cogulo sanguneo: Engineer, mining, hinchazn, calor en una pierna, falta de aire, dolor en el [employer]  en la vescula biliar: dolor de estmago intenso, nuseas, vmitos, fiebre Aumento de la presin arterial Lesin en el hgado: dolor en la regin abdominal superior derecha, prdida de apetito, nuseas, heces de color claro, orina amarilla oscura o marrn, color amarillento de los ojos o la piel, debilidad o fatiga inusuales Migraas o dolores de cabeza nuevos o Contractor, enrojecimiento o Marketing executive de la inyeccin Accidente cerebrovascular: entumecimiento o debilidad repentinos de la cara, un brazo o una pierna, dificultad para hablar, confusin, dificultad para caminar, prdida de equilibrio o coordinacin, mareos, dolor de cabeza intenso, cambio en la visin Flujo vaginal inusual, comezn/picazn u olor Empeoramiento del estado de nimo, sentimientos de depresin Efectos secundarios que generalmente no requieren atencin mdica (debe informarlos a su equipo de atencin si persisten o si son molestos): Engineer, mining o sensibilidad de las Designer, fashion/clothing oscuras en la piel de la cara o de otras reas expuestas al sol Ciclos menstruales irregulares o sangrado ligero entre periodos menstruales Nuseas Aumento de peso Puede ser que esta lista no menciona todos los posibles efectos secundarios. Comunquese a su mdico por asesoramiento mdico [employer]. Usted puede informar los efectos secundarios a la FDA por telfono al 1-800-FDA-1088. Dnde debo guardar mi medicina? Este medicamento se administra en hospitales o clnicas y no es necesario guardarlo en su domicilio. <b>ATENCIN: Este folleto es un resumen. Puede ser que no cubra toda la posible informacin. Si usted tiene preguntas acerca de esta medicina, consulte con su mdico, su farmacutico o su profesional de Dietitian.</b>  2024 Elsevier/Gold Standard (2022-09-22 00:00:00)

## 2024-02-06 ENCOUNTER — Encounter (HOSPITAL_COMMUNITY): Payer: Self-pay

## 2024-02-06 ENCOUNTER — Ambulatory Visit (HOSPITAL_COMMUNITY)
Admission: EM | Admit: 2024-02-06 | Discharge: 2024-02-06 | Disposition: A | Discharge status: Home / Self Care | Payer: Self-pay | Attending: Family Medicine | Admitting: Family Medicine

## 2024-02-06 ENCOUNTER — Other Ambulatory Visit: Payer: Self-pay

## 2024-02-06 DIAGNOSIS — H65192 Other acute nonsuppurative otitis media, left ear: Secondary | ICD-10-CM

## 2024-02-06 MED ORDER — AMOXICILLIN 875 MG PO TABS
875.0000 mg | ORAL_TABLET | Freq: Two times a day (BID) | ORAL | 0 refills | Status: AC
  Filled 2024-02-06 (×3): qty 20, 10d supply, fill #0

## 2024-02-06 NOTE — Discharge Instructions (Signed)
 Hoy le diagnosticaron una infeccin de odo. Le recet un antibitico para que lo tome [employer] al da durante [address]. Puede usar Tylenol para Chief Technology Officer. Le recomiendo Claritin o Zyrtec de venta libre para Paramedic la congestin y el drenaje sinusal. Si no mejora, por favor, regrese.  You were seen diagnosed with an ear infection today.  I have sent out an antibiotic to take twice/day x 10 days.  You may use tylenol for pain.  I recommend over the coutner claritin or zyrtec to help with sinus congestion, drainage. Please return if not improving.

## 2024-02-06 NOTE — ED Provider Notes (Signed)
 [facility]    CSN: [id number] Arrival date & time: 02/06/24  5621      History   Chief Complaint Chief Complaint  Patient presents with   Otalgia    HPI Carrie Delacruz is a 33 y.o. female.    Otalgia Associated symptoms: congestion    Spanish interpreter used today.   Patient is here for sinus congestion x 7 days, and then left ear pain since last night.  No fevers/chills.  She has used tylenol for pain.  No other medications taken.        Past Medical History:  Diagnosis Date   Cholestasis    Gestational diabetes mellitus (GDM) in third trimester 05/16/2021   Intrahepatic cholestasis of pregnancy, antepartum 05/17/2021   05/15/21 Bile acids 16.6, nml LFTs >>Ursodiol prescribed.   Metrorrhagia    Pruritus of pregnancy in third trimester 05/15/2021    Patient Active Problem List   Diagnosis Date Noted   History of gestational diabetes 03/24/2022   Nexplanon in place 08/07/2021   History of cholestasis during pregnancy 01/28/2021    Past Surgical History:  Procedure Laterality Date   NO PAST SURGERIES      OB History     Gravida  2   Para  2   Term      Preterm  2   AB      Living  2      SAB      IAB      Ectopic      Multiple  0   Live Births  2            Home Medications    Prior to Admission medications   Medication Sig Start Date End Date Taking? Authorizing Provider  acetaminophen (TYLENOL) 500 MG tablet Take 2 tablets (1,000 mg total) by mouth every 6 (six) hours as needed for mild pain. 06/25/21   Anyanwu, Jethro Bastos, MD  ibuprofen (ADVIL) 600 MG tablet Take 1 tablet (600 mg total) by mouth every 6 (six) hours as needed for moderate pain, headache or cramping. Patient not taking: Reported on 09/22/2021 06/25/21   Anyanwu, Jethro Bastos, MD  senna-docusate (SENOKOT-S) 8.6-50 MG tablet Take 2 tablets by mouth at bedtime as needed for moderate constipation. Patient not taking: Reported on 09/22/2021 06/25/21    Tereso Newcomer, MD    Family History Family History  Problem Relation Age of Onset   Diabetes Mother    Asthma Mother     Social History Social History   Tobacco Use   Smoking status: Never   Smokeless tobacco: Never  Vaping Use   Vaping status: Never Used  Substance Use Topics   Alcohol use: Never   Drug use: Never     Allergies   Patient has no known allergies.   Review of Systems Review of Systems  Constitutional: Negative.   HENT:  Positive for congestion and ear pain.   Respiratory: Negative.    Cardiovascular: Negative.   Gastrointestinal: Negative.   Genitourinary: Negative.   Musculoskeletal: Negative.   Psychiatric/Behavioral: Negative.       Physical Exam Triage Vital Signs ED Triage Vitals  Encounter Vitals Group     BP 02/06/24 0955 (!) 136/97     Systolic BP Percentile --      Diastolic BP Percentile --      Pulse Rate 02/06/24 0955 69     Resp 02/06/24 0955 16     Temp 02/06/24 0955  98.5 F (36.9 C)     Temp Source 02/06/24 0955 Oral     SpO2 02/06/24 0955 98 %     Weight --      Height --      Head Circumference --      Peak Flow --      Pain Score 02/06/24 0953 9     Pain Loc --      Pain Education --      Exclude from Growth Chart --    No data found.  Updated Vital Signs BP (!) 136/97 (BP Location: Left Arm)   Pulse 69   Temp 98.5 F (36.9 C) (Oral)   Resp 16   LMP 02/03/2024 (Approximate)   SpO2 98%   Breastfeeding No   Visual Acuity Right Eye Distance:   Left Eye Distance:   Bilateral Distance:    Right Eye Near:   Left Eye Near:    Bilateral Near:     Physical Exam Constitutional:      Appearance: Normal appearance. She is normal weight.  HENT:     Right Ear: Tympanic membrane normal.     Left Ear: Tympanic membrane is erythematous and bulging.     Nose: Congestion present.  Cardiovascular:     Rate and Rhythm: Normal rate and regular rhythm.  Pulmonary:     Effort: Pulmonary effort is normal.      Breath sounds: Normal breath sounds.  Musculoskeletal:     Cervical back: Normal range of motion and neck supple. No tenderness.  Lymphadenopathy:     Cervical: No cervical adenopathy.  Neurological:     General: No focal deficit present.     Mental Status: She is alert.  Psychiatric:        Mood and Affect: Mood normal.      UC Treatments / Results  Labs (all labs ordered are listed, but only abnormal results are displayed) Labs Reviewed - No data to display  EKG   Radiology No results found.  Procedures Procedures (including critical care time)  Medications Ordered in UC Medications - No data to display  Initial Impression / Assessment and Plan / UC Course  I have reviewed the triage vital signs and the nursing notes.  Pertinent labs & imaging results that were available during my care of the patient were reviewed by me and considered in my medical decision making (see chart for details).      Final Clinical Impressions(s) / UC Diagnoses   Final diagnoses:  Acute mucoid otitis media of left ear     Discharge Instructions      Hoy le diagnosticaron una infeccin de odo. Le recet un antibitico para que lo tome [employer] al da durante [address]. Puede usar Tylenol para Chief Technology Officer. Le recomiendo Claritin o Zyrtec de venta libre para Paramedic la congestin y el drenaje sinusal. Si no mejora, por favor, regrese.  You were seen diagnosed with an ear infection today.  I have sent out an antibiotic to take twice/day x 10 days.  You may use tylenol for pain.  I recommend over the coutner claritin or zyrtec to help with sinus congestion, drainage. Please return if not improving.     ED Prescriptions     Medication Sig Dispense Auth. Provider   amoxicillin (AMOXIL) 875 MG tablet Take 1 tablet (875 mg total) by mouth 2 (two) times daily for 10 days. 20 tablet Jannifer Franklin, MD      PDMP not reviewed  this encounter.   Jannifer Franklin, MD 02/06/24 1009

## 2024-02-06 NOTE — ED Triage Notes (Signed)
 Patient here today with c/o left ear pain and nasal congestion since this past Sunday. She has taken Tylenol with some relief with the pain.

## 2024-08-26 ENCOUNTER — Encounter (HOSPITAL_COMMUNITY): Payer: Self-pay

## 2024-08-26 ENCOUNTER — Ambulatory Visit (HOSPITAL_COMMUNITY)
Admission: EM | Admit: 2024-08-26 | Discharge: 2024-08-26 | Disposition: A | Discharge status: Home / Self Care | Payer: Self-pay | Attending: Emergency Medicine | Admitting: Emergency Medicine

## 2024-08-26 DIAGNOSIS — L03011 Cellulitis of right finger: Secondary | ICD-10-CM

## 2024-08-26 MED ORDER — DOXYCYCLINE HYCLATE 100 MG PO TABS: 100.0000 mg | ORAL_TABLET | Freq: Two times a day (BID) | ORAL | 0 refills | Status: AC

## 2024-08-26 NOTE — ED Triage Notes (Signed)
 Patient here today with c/o right thumb nail infection X 3 days. Area is tender, sore, red, swollen, and painful.

## 2024-08-26 NOTE — ED Provider Notes (Signed)
 [facility]    CSN: [id number] Arrival date & time: 08/26/24  1715      History   Chief Complaint Chief Complaint  Patient presents with   Nail Problem    HPI Carrie Delacruz is a 33 y.o. female.   Video medical spanish interpretor used for this encounter  Bit a hangnail from her thumb around 6 days ago  A few days ago she noticed the area was warm, hot and swollen  Yellow area close to nail, throbbing pain Pain got worse today so she decided to come into clinic  The history is provided by the patient and medical records. The history is limited by a language barrier. A language interpreter was used.    Past Medical History:  Diagnosis Date   Cholestasis (HCC)    Gestational diabetes mellitus (GDM) in third trimester 05/16/2021   Intrahepatic cholestasis of pregnancy, antepartum 05/17/2021   05/15/21 Bile acids 16.6, nml LFTs >>Ursodiol  prescribed.   Metrorrhagia    Pruritus of pregnancy in third trimester 05/15/2021    Patient Active Problem List   Diagnosis Date Noted   History of gestational diabetes 03/24/2022   Nexplanon  in place 08/07/2021   History of cholestasis during pregnancy 01/28/2021    Past Surgical History:  Procedure Laterality Date   NO PAST SURGERIES      OB History     Gravida  2   Para  2   Term      Preterm  2   AB      Living  2      SAB      IAB      Ectopic      Multiple  0   Live Births  2            Home Medications    Prior to Admission medications   Medication Sig Start Date End Date Taking? Authorizing Provider  doxycycline (VIBRA-TABS) 100 MG tablet Take 1 tablet (100 mg total) by mouth 2 (two) times daily for 7 days. 08/26/24 09/02/24 Yes Donyae Kilner  N, FNP  acetaminophen  (TYLENOL ) 500 MG tablet Take 2 tablets (1,000 mg total) by mouth every 6 (six) hours as needed for mild pain. 06/25/21   Anyanwu, Gloris LABOR, MD    Family History Family History  Problem Relation Age of  Onset   Diabetes Mother    Asthma Mother     Social History Social History   Tobacco Use   Smoking status: Never   Smokeless tobacco: Never  Vaping Use   Vaping status: Never Used  Substance Use Topics   Alcohol use: Never   Drug use: Never     Allergies   Patient has no known allergies.   Review of Systems Review of Systems  Per HPI  Physical Exam Triage Vital Signs ED Triage Vitals  Encounter Vitals Group     BP 08/26/24 1805 123/84     Girls Systolic BP Percentile --      Girls Diastolic BP Percentile --      Boys Systolic BP Percentile --      Boys Diastolic BP Percentile --      Pulse Rate 08/26/24 1805 67     Resp 08/26/24 1805 16     Temp 08/26/24 1805 98.5 F (36.9 C)     Temp Source 08/26/24 1805 Oral     SpO2 08/26/24 1805 98 %     Weight --  Height --      Head Circumference --      Peak Flow --      Pain Score 08/26/24 1804 9     Pain Loc --      Pain Education --      Exclude from Growth Chart --    No data found.  Updated Vital Signs BP 123/84 (BP Location: Left Arm)   Pulse 67   Temp 98.5 F (36.9 C) (Oral)   Resp 16   LMP 08/23/2024 (Approximate)   SpO2 98%   Visual Acuity Right Eye Distance:   Left Eye Distance:   Bilateral Distance:    Right Eye Near:   Left Eye Near:    Bilateral Near:     Physical Exam Vitals and nursing note reviewed.  Constitutional:      Appearance: Normal appearance.  HENT:     Head: Normocephalic and atraumatic.     Right Ear: External ear normal.     Left Ear: External ear normal.     Nose: Nose normal.     Mouth/Throat:     Mouth: Mucous membranes are moist.  Eyes:     Conjunctiva/sclera: Conjunctivae normal.  Cardiovascular:     Rate and Rhythm: Normal rate.  Pulmonary:     Effort: Pulmonary effort is normal. No respiratory distress.  Skin:    General: Skin is warm and dry.      Neurological:     General: No focal deficit present.     Mental Status: She is alert and oriented  to person, place, and time.  Psychiatric:        Mood and Affect: Mood normal.        Behavior: Behavior normal.      UC Treatments / Results  Labs (all labs ordered are listed, but only abnormal results are displayed) Labs Reviewed - No data to display  EKG   Radiology No results found.  Procedures Incision and Drainage  Date/Time: 08/26/2024 6:37 PM  Performed by: Dreama, Josiane Labine  N, FNP Authorized by: Dreama Wilnette SAILOR, FNP   Consent:    Consent obtained:  Verbal   Consent given by:  Patient   Risks, benefits, and alternatives were discussed: yes     Risks discussed:  Bleeding, incomplete drainage and pain   Alternatives discussed:  Alternative treatment and observation Universal protocol:    Procedure explained and questions answered to patient or proxy's satisfaction: yes     Patient identity confirmed:  Verbally with patient Location:    Indications for incision and drainage: Paronychia.   Location:  Upper extremity   Upper extremity location:  Finger   Finger location:  R thumb Pre-procedure details:    Procedure prep: Alcohol. Anesthesia:    Anesthesia method:  Topical application   Topical anesthesia: Pain ease. Procedure type:    Complexity:  Simple Procedure details:    Incision types:  Stab incision   Drainage:  Purulent   Drainage amount:  Scant   Wound treatment:  Wound left open   Packing materials:  None Post-procedure details:    Procedure completion:  Tolerated well, no immediate complications  (including critical care time)  Medications Ordered in UC Medications - No data to display  Initial Impression / Assessment and Plan / UC Course  I have reviewed the triage vital signs and the nursing notes.  Pertinent labs & imaging results that were available during my care of the patient were reviewed by me and considered in  my medical decision making (see chart for details).  Vitals and triage reviewed, patient is hemodynamically  stable.  Right thumb with paronychia with purulent collection.  Area cleaned, numbed with pain ease and single stab incision to remove purulent fluid.  Good drainage.  Did bite a hangnail prior to infection, will cover with doxycycline for MRSA.  Pain management discussed.  Wound care discussed.  Plan of care, follow-up care return precautions given, no questions at this time.    Final Clinical Impressions(s) / UC Diagnoses   Final diagnoses:  Paronychia of right thumb     Discharge Instructions      Remoje la zona a diario en agua tibia y burkina faso solucin antibacteriana como clorhexidina o Hibiclens. Mantngala cubierta con una curita. Tome los [employer] veces al da con alimentos durante los prximos 7 das para tratar la infeccin. Evite morderse las uas en el futuro. Puede tomar Tylenol  o ibuprofeno segn sea necesario para el dolor. La infeccin debera mejorar en los prximos das con antibiticos. Si no observa mejora ni cambios, regrese a la clnica para una reevaluacin.  Soak the area in warm water and antibacterial solution such as chlorhexidine or Hibiclens daily.  Keep it covered with a Band-Aid.  Take the antibiotics twice daily with food for the next 7 days to treat your infection.  Avoid biting your nails in the future.  Can take Tylenol  or ibuprofen  as needed for pain.  Infection should improve over the next few days on antibiotics.  If no improvement or any changes return to clinic for reevaluation.     ED Prescriptions     Medication Sig Dispense Auth. Provider   doxycycline (VIBRA-TABS) 100 MG tablet Take 1 tablet (100 mg total) by mouth 2 (two) times daily for 7 days. 14 tablet Dreama, Dorreen Valiente  N, FNP      PDMP not reviewed this encounter.   Dreama Dain SAILOR, FNP 08/26/24 1839

## 2024-08-26 NOTE — Discharge Instructions (Addendum)
 Remoje la zona a diario en agua tibia y burkina faso solucin antibacteriana como clorhexidina o Hibiclens. Mantngala cubierta con una curita. Tome los [employer] veces al da con alimentos durante los prximos 7 das para tratar la infeccin. Evite morderse las uas en el futuro. Puede tomar Tylenol  o ibuprofeno segn sea necesario para el dolor. La infeccin debera mejorar en los prximos das con antibiticos. Si no observa mejora ni cambios, regrese a la clnica para una reevaluacin.  Soak the area in warm water and antibacterial solution such as chlorhexidine or Hibiclens daily.  Keep it covered with a Band-Aid.  Take the antibiotics twice daily with food for the next 7 days to treat your infection.  Avoid biting your nails in the future.  Can take Tylenol  or ibuprofen  as needed for pain.  Infection should improve over the next few days on antibiotics.  If no improvement or any changes return to clinic for reevaluation.

## 2024-12-17 ENCOUNTER — Ambulatory Visit: Payer: Self-pay

## 2024-12-21 ENCOUNTER — Encounter: Payer: Self-pay | Admitting: *Deleted

## 2024-12-21 ENCOUNTER — Ambulatory Visit: Payer: Self-pay | Admitting: *Deleted

## 2024-12-21 VITALS — BP 124/73 | HR 84 | Temp 98.6°F | Ht 58.2 in | Wt 147.6 lb

## 2024-12-21 DIAGNOSIS — N939 Abnormal uterine and vaginal bleeding, unspecified: Secondary | ICD-10-CM

## 2024-12-21 LAB — CBC WITH DIFFERENTIAL/PLATELET
Basophils Absolute: 0.1 10*3/uL (ref 0.0–0.2)
Basos: 1 %
EOS (ABSOLUTE): 0.1 10*3/uL (ref 0.0–0.4)
Eos: 1 %
Hematocrit: 44.5 % (ref 34.0–46.6)
Hemoglobin: 14.7 g/dL (ref 11.1–15.9)
Immature Grans (Abs): 0 10*3/uL (ref 0.0–0.1)
Immature Granulocytes: 0 %
Lymphocytes Absolute: 2.5 10*3/uL (ref 0.7–3.1)
Lymphs: 25 %
MCH: 29.3 pg (ref 26.6–33.0)
MCHC: 33 g/dL (ref 31.5–35.7)
MCV: 89 fL (ref 79–97)
Monocytes Absolute: 0.7 10*3/uL (ref 0.1–0.9)
Monocytes: 7 %
Neutrophils Absolute: 6.6 10*3/uL (ref 1.4–7.0)
Neutrophils: 66 %
Platelets: 307 10*3/uL (ref 150–450)
RBC: 5.01 x10E6/uL (ref 3.77–5.28)
RDW: 12.9 % (ref 11.7–15.4)
WBC: 9.9 10*3/uL (ref 3.4–10.8)

## 2024-12-21 NOTE — Progress Notes (Signed)
 "   Patient ID: Carrie Delacruz, female    DOB: 05/27/1991  MRN: [id number]  CC: Abdominal Pain (Irregular period /Bleeding every day /Cervical pain X3 WEEKS/Difficulty of breathing 3 times on nov 2 times/ jan 1 time )   Subjective: Carrie Delacruz is a 34 y.o. female who presents for evaluation of abnormal vaginal bleeding post Nexplanon  insertion (placed in 2024.) She has been bleeding daily since December 2025. She does have pelvic pain left greater than left right as well.  She is not sexually active and denies overt signs of an vaginal infection. She rates her pain at an 8 or 9 that is fairly well-controlled on Tylenol .   This may be caused by functional ovarian cysts  Pertinent past medical history include: History of cholestasis during pregnancy, Nexplanon  in place, history of gestational diabetes    Patient Active Problem List   Diagnosis Date Noted   History of gestational diabetes 03/24/2022   Nexplanon  in place 08/07/2021   History of cholestasis during pregnancy 01/28/2021     Medications Ordered Prior to Encounter[1]  Allergies[2]  Social History   Socioeconomic History   Marital status: Single    Spouse name: Not on file   Number of children: Not on file   Years of education: Not on file   Highest education level: Not on file  Occupational History   Not on file  Tobacco Use   Smoking status: Never   Smokeless tobacco: Never  Vaping Use   Vaping status: Never Used  Substance and Sexual Activity   Alcohol use: Never   Drug use: Never   Sexual activity: Yes    Birth control/protection: None    Comment: Depo Provera  Other Topics Concern   Not on file  Social History Narrative   Not on file   Social Drivers of Health   Tobacco Use: Low Risk (08/26/2024)   Patient History    Smoking Tobacco Use: Never    Smokeless Tobacco Use: Never    Passive Exposure: Not on file  Financial Resource Strain: Not on file  Food Insecurity:  Food Insecurity Present (12/21/2024)   Epic    Worried About Programme Researcher, Broadcasting/film/video in the Last Year: Sometimes true    Ran Out of Food in the Last Year: Never true  Transportation Needs: No Transportation Needs (12/21/2024)   Epic    Lack of Transportation (Medical): No    Lack of Transportation (Non-Medical): No  Physical Activity: Not on file  Stress: Not on file  Social Connections: Not on file  Intimate Partner Violence: Not At Risk (12/21/2024)   Epic    Fear of Current or Ex-Partner: No    Emotionally Abused: No    Physically Abused: No    Sexually Abused: No  Depression (PHQ2-9): Low Risk (12/21/2024)   Depression (PHQ2-9)    PHQ-2 Score: 2  Alcohol Screen: Not on file  Housing: Unknown (12/21/2024)   Epic    Unable to Pay for Housing in the Last Year: No    Number of Times Moved in the Last Year: Not on file    Homeless in the Last Year: No  Utilities: Not At Risk (12/21/2024)   Epic    Threatened with loss of utilities: No  Health Literacy: Not on file    Family History  Problem Relation Age of Onset   Diabetes Mother    Asthma Mother     Past Surgical History:  Procedure Laterality Date  NO PAST SURGERIES      ROS: Review of Systems Negative except as stated above  PHYSICAL EXAM: BP 124/73   Pulse 84   Temp 98.6 F (37 C) (Oral)   Ht 4' 10.2 (1.478 m)   Wt 67 kg   LMP 11/21/2024 (Approximate)   SpO2 97%   BMI 30.64 kg/m   Physical Exam Vitals and nursing note reviewed.  Cardiovascular:     Rate and Rhythm: Normal rate and regular rhythm.  Pulmonary:     Effort: Pulmonary effort is normal.     Breath sounds: Normal breath sounds.  Abdominal:     General: Abdomen is flat.     Palpations: Abdomen is soft.     Comments: Non tender to palpation today though she reports more pain over the left ovary than the right  Skin:    General: Skin is warm and dry.  Neurological:     Mental Status: She is alert. Mental status is at baseline.  Psychiatric:         Mood and Affect: Mood normal.          Latest Ref Rng & Units 06/27/2023   11:05 AM 03/24/2022   11:57 AM 06/24/2021    4:24 AM  CMP  Glucose 70 - 99 mg/dL 79  75  94   BUN 6 - 20 mg/dL 12  11    Creatinine 9.42 - 1.00 mg/dL 9.40  9.45    Sodium 865 - 144 mmol/L 138  139    Potassium 3.5 - 5.2 mmol/L 4.3  3.8    Chloride 96 - 106 mmol/L 101  101    CO2 20 - 29 mmol/L 23  22    Calcium 8.7 - 10.2 mg/dL 9.3  9.3    Total Protein 6.0 - 8.5 g/dL 7.2  7.5    Total Bilirubin 0.0 - 1.2 mg/dL 0.4  0.4    Alkaline Phos 44 - 121 IU/L 97  119    AST 0 - 40 IU/L 17  19    ALT 0 - 32 IU/L 9  14     Lipid Panel     Component Value Date/Time   CHOL 207 (H) 03/24/2022 1157   TRIG 109 03/24/2022 1157   HDL 54 03/24/2022 1157   CHOLHDL 2.6 03/28/2018 1627   LDLCALC 134 (H) 03/24/2022 1157    CBC    Component Value Date/Time   WBC 7.4 06/27/2023 1105   WBC 8.8 06/23/2021 0038   RBC 5.00 06/27/2023 1105   RBC 4.66 06/23/2021 0038   HGB 14.2 06/27/2023 1105   HCT 43.2 06/27/2023 1105   PLT 285 06/27/2023 1105   MCV 86 06/27/2023 1105   MCH 28.4 06/27/2023 1105   MCH 29.2 06/23/2021 0038   MCHC 32.9 06/27/2023 1105   MCHC 33.3 06/23/2021 0038   RDW 13.2 06/27/2023 1105   LYMPHSABS 2.2 06/27/2023 1105   EOSABS 0.1 06/27/2023 1105   BASOSABS 0.0 06/27/2023 1105    Results for orders placed or performed in visit on 06/27/23  CMP14+EGFR   Collection Time: 06/27/23 11:05 AM  Result Value Ref Range   Glucose 79 70 - 99 mg/dL   BUN 12 6 - 20 mg/dL   Creatinine, Ser 9.40 0.57 - 1.00 mg/dL   eGFR 876 >40 fO/fpw/8.26   BUN/Creatinine Ratio 20 9 - 23   Sodium 138 134 - 144 mmol/L   Potassium 4.3 3.5 - 5.2 mmol/L   Chloride 101 96 -  106 mmol/L   CO2 23 20 - 29 mmol/L   Calcium 9.3 8.7 - 10.2 mg/dL   Total Protein 7.2 6.0 - 8.5 g/dL   Albumin 4.2 3.9 - 4.9 g/dL   Globulin, Total 3.0 1.5 - 4.5 g/dL   Bilirubin Total 0.4 0.0 - 1.2 mg/dL   Alkaline Phosphatase 97 44 - 121 IU/L    AST 17 0 - 40 IU/L   ALT 9 0 - 32 IU/L  CBC with Differential/Platelet   Collection Time: 06/27/23 11:05 AM  Result Value Ref Range   WBC 7.4 3.4 - 10.8 x10E3/uL   RBC 5.00 3.77 - 5.28 x10E6/uL   Hemoglobin 14.2 11.1 - 15.9 g/dL   Hematocrit 56.7 65.9 - 46.6 %   MCV 86 79 - 97 fL   MCH 28.4 26.6 - 33.0 pg   MCHC 32.9 31.5 - 35.7 g/dL   RDW 86.7 88.2 - 84.5 %   Platelets 285 150 - 450 x10E3/uL   Neutrophils 63 Not Estab. %   Lymphs 29 Not Estab. %   Monocytes 7 Not Estab. %   Eos 1 Not Estab. %   Basos 0 Not Estab. %   Neutrophils Absolute 4.6 1.4 - 7.0 x10E3/uL   Lymphocytes Absolute 2.2 0.7 - 3.1 x10E3/uL   Monocytes Absolute 0.5 0.1 - 0.9 x10E3/uL   EOS (ABSOLUTE) 0.1 0.0 - 0.4 x10E3/uL   Basophils Absolute 0.0 0.0 - 0.2 x10E3/uL   Immature Granulocytes 0 Not Estab. %   Immature Grans (Abs) 0.0 0.0 - 0.1 x10E3/uL  Hemoglobin A1c   Collection Time: 06/27/23 11:05 AM  Result Value Ref Range   Hgb A1c MFr Bld 5.9 (H) 4.8 - 5.6 %   Est. average glucose Bld gHb Est-mCnc 123 mg/dL     ASSESSMENT AND PLAN:  Assessment & Plan Abnormal vaginal bleeding As above patient has had abnormal vaginal bleeding despite having a Nexplanon  in since December 2025. The Nexplanon  was placed IN 2024. She denies overt symptoms of vaginal infection.  Denies possibility of pregnancy as she is not sexually active. She denies fever or chills.  She rates her pain as an 8 or 9 out of 10 and treats with Tylenol  with fairly good results. Need to rule out infection with a self wet prep. I will review the results when they are available and the results will be sent to patient May need a pelvic ultrasound to rule out functional ovarian cysts. Due to the significant nature of the bleeding I did get a CBC to make sure she has not become anemic Orders:   Cervicovaginal ancillary only   CBC with Differential       Patient was given the opportunity to ask questions.  Patient verbalized understanding of  the plan and was able to repeat key elements of the plan.   This documentation was completed using Paediatric nurse.  Any transcriptional errors are unintentional.     Requested Prescriptions    No prescriptions requested or ordered in this encounter    Return in about 3 months (around 03/20/2025) for with PCP.  Daniell Mancinas H, NP     [1]  Current Outpatient Medications on File Prior to Visit  Medication Sig Dispense Refill   acetaminophen  (TYLENOL ) 500 MG tablet Take 2 tablets (1,000 mg total) by mouth every 6 (six) hours as needed for mild pain. 60 tablet 2   No current facility-administered medications on file prior to visit.  [2] No Known Allergies  "

## 2024-12-21 NOTE — Patient Instructions (Signed)
 Today we discussed your abnormal vaginal bleeding that has been going on since December 2025. Your Nexplanon  has been in place since 2024. As discussed we need to make sure you do not have a functional ovarian cyst. This would require a pelvic ultrasound.  We also should be concerned about ectopic pregnancy but you report that you are not sexually active. We are ruling out infection with a self wet prep. Will notify you of these results when they are available.  You do have pelvic pain left greater than right about 8 or 9 on a scale of 1-10 and take Tylenol  for that.  You denied fever or chills and again not sexually active. Since you bleed every day I think we should check a CBC. Will notify you with the results of the wet prep and plan accordingly. certainly call if you have any questions or concerns and keep your appointment with your primary care provider

## 2025-03-21 ENCOUNTER — Ambulatory Visit: Payer: Self-pay | Admitting: Family Medicine
